# Patient Record
Sex: Female | Born: 1940 | Race: White | Hispanic: No | Marital: Married | Smoking: Current every day smoker
Health system: Southern US, Community
[De-identification: ages and names within clinical notes are randomized; demographics above are authoritative.]

## PROBLEM LIST (undated history)

## (undated) DIAGNOSIS — I890 Lymphedema, not elsewhere classified: Secondary | ICD-10-CM

## (undated) DIAGNOSIS — G8929 Other chronic pain: Secondary | ICD-10-CM

## (undated) DIAGNOSIS — M549 Dorsalgia, unspecified: Secondary | ICD-10-CM

## (undated) DIAGNOSIS — E785 Hyperlipidemia, unspecified: Secondary | ICD-10-CM

## (undated) DIAGNOSIS — F17200 Nicotine dependence, unspecified, uncomplicated: Secondary | ICD-10-CM

## (undated) DIAGNOSIS — I10 Essential (primary) hypertension: Secondary | ICD-10-CM

## (undated) HISTORY — DX: Nicotine dependence, unspecified, uncomplicated: F17.200

## (undated) HISTORY — PX: BACK SURGERY: SHX140

## (undated) HISTORY — DX: Lymphedema, not elsewhere classified: I89.0

---

## 2011-02-12 DIAGNOSIS — M159 Polyosteoarthritis, unspecified: Secondary | ICD-10-CM | POA: Insufficient documentation

## 2011-07-17 DIAGNOSIS — R609 Edema, unspecified: Secondary | ICD-10-CM | POA: Insufficient documentation

## 2011-08-20 DIAGNOSIS — S96909A Unspecified injury of unspecified muscle and tendon at ankle and foot level, unspecified foot, initial encounter: Secondary | ICD-10-CM | POA: Insufficient documentation

## 2011-08-20 DIAGNOSIS — L02419 Cutaneous abscess of limb, unspecified: Secondary | ICD-10-CM | POA: Insufficient documentation

## 2012-02-20 DIAGNOSIS — M62838 Other muscle spasm: Secondary | ICD-10-CM | POA: Insufficient documentation

## 2012-02-20 DIAGNOSIS — M542 Cervicalgia: Secondary | ICD-10-CM | POA: Insufficient documentation

## 2012-05-07 DIAGNOSIS — M48061 Spinal stenosis, lumbar region without neurogenic claudication: Secondary | ICD-10-CM | POA: Insufficient documentation

## 2012-06-30 DIAGNOSIS — Z8249 Family history of ischemic heart disease and other diseases of the circulatory system: Secondary | ICD-10-CM | POA: Insufficient documentation

## 2012-06-30 DIAGNOSIS — G8929 Other chronic pain: Secondary | ICD-10-CM | POA: Insufficient documentation

## 2013-02-15 DIAGNOSIS — Z9889 Other specified postprocedural states: Secondary | ICD-10-CM | POA: Insufficient documentation

## 2013-02-15 DIAGNOSIS — Z9289 Personal history of other medical treatment: Secondary | ICD-10-CM | POA: Insufficient documentation

## 2013-11-10 DIAGNOSIS — M19019 Primary osteoarthritis, unspecified shoulder: Secondary | ICD-10-CM | POA: Insufficient documentation

## 2014-09-02 DIAGNOSIS — S82831A Other fracture of upper and lower end of right fibula, initial encounter for closed fracture: Secondary | ICD-10-CM | POA: Insufficient documentation

## 2014-09-05 DIAGNOSIS — Z8701 Personal history of pneumonia (recurrent): Secondary | ICD-10-CM | POA: Insufficient documentation

## 2014-10-11 DIAGNOSIS — R0602 Shortness of breath: Secondary | ICD-10-CM | POA: Insufficient documentation

## 2014-12-25 DIAGNOSIS — W19XXXA Unspecified fall, initial encounter: Secondary | ICD-10-CM | POA: Insufficient documentation

## 2014-12-25 DIAGNOSIS — M2550 Pain in unspecified joint: Secondary | ICD-10-CM | POA: Insufficient documentation

## 2014-12-25 DIAGNOSIS — R0782 Intercostal pain: Secondary | ICD-10-CM | POA: Insufficient documentation

## 2014-12-26 DIAGNOSIS — R0789 Other chest pain: Secondary | ICD-10-CM | POA: Insufficient documentation

## 2014-12-26 DIAGNOSIS — M25551 Pain in right hip: Secondary | ICD-10-CM | POA: Insufficient documentation

## 2015-12-11 DIAGNOSIS — R011 Cardiac murmur, unspecified: Secondary | ICD-10-CM | POA: Insufficient documentation

## 2016-04-23 DIAGNOSIS — L909 Atrophic disorder of skin, unspecified: Secondary | ICD-10-CM | POA: Insufficient documentation

## 2016-04-23 DIAGNOSIS — Z96612 Presence of left artificial shoulder joint: Secondary | ICD-10-CM | POA: Insufficient documentation

## 2016-04-23 DIAGNOSIS — R29898 Other symptoms and signs involving the musculoskeletal system: Secondary | ICD-10-CM | POA: Insufficient documentation

## 2016-06-04 DIAGNOSIS — Z7952 Long term (current) use of systemic steroids: Secondary | ICD-10-CM | POA: Insufficient documentation

## 2017-02-14 DIAGNOSIS — Z79891 Long term (current) use of opiate analgesic: Secondary | ICD-10-CM | POA: Insufficient documentation

## 2017-02-14 DIAGNOSIS — Z5181 Encounter for therapeutic drug level monitoring: Secondary | ICD-10-CM | POA: Insufficient documentation

## 2017-02-14 DIAGNOSIS — M19171 Post-traumatic osteoarthritis, right ankle and foot: Secondary | ICD-10-CM | POA: Insufficient documentation

## 2017-02-18 DIAGNOSIS — I359 Nonrheumatic aortic valve disorder, unspecified: Secondary | ICD-10-CM | POA: Insufficient documentation

## 2017-03-25 DIAGNOSIS — M7918 Myalgia, other site: Secondary | ICD-10-CM | POA: Insufficient documentation

## 2017-03-25 DIAGNOSIS — M25569 Pain in unspecified knee: Secondary | ICD-10-CM | POA: Insufficient documentation

## 2017-03-25 DIAGNOSIS — M5481 Occipital neuralgia: Secondary | ICD-10-CM | POA: Insufficient documentation

## 2017-04-11 DIAGNOSIS — H6123 Impacted cerumen, bilateral: Secondary | ICD-10-CM | POA: Insufficient documentation

## 2017-07-16 DIAGNOSIS — M5136 Other intervertebral disc degeneration, lumbar region: Secondary | ICD-10-CM | POA: Insufficient documentation

## 2017-07-16 DIAGNOSIS — M51369 Other intervertebral disc degeneration, lumbar region without mention of lumbar back pain or lower extremity pain: Secondary | ICD-10-CM | POA: Insufficient documentation

## 2017-07-16 DIAGNOSIS — M1990 Unspecified osteoarthritis, unspecified site: Secondary | ICD-10-CM | POA: Insufficient documentation

## 2017-07-16 DIAGNOSIS — G243 Spasmodic torticollis: Secondary | ICD-10-CM | POA: Insufficient documentation

## 2017-07-16 DIAGNOSIS — M961 Postlaminectomy syndrome, not elsewhere classified: Secondary | ICD-10-CM | POA: Insufficient documentation

## 2017-07-16 DIAGNOSIS — M792 Neuralgia and neuritis, unspecified: Secondary | ICD-10-CM | POA: Insufficient documentation

## 2017-08-29 DIAGNOSIS — K5903 Drug induced constipation: Secondary | ICD-10-CM | POA: Insufficient documentation

## 2017-08-29 DIAGNOSIS — T402X5A Adverse effect of other opioids, initial encounter: Secondary | ICD-10-CM | POA: Insufficient documentation

## 2017-10-18 ENCOUNTER — Encounter: Payer: Self-pay | Admitting: Emergency Medicine

## 2017-10-18 ENCOUNTER — Inpatient Hospital Stay
Admission: EM | Admit: 2017-10-18 | Discharge: 2017-10-20 | DRG: 193 | Disposition: A | Discharge status: Home / Self Care | Payer: Medicare Other | Attending: Internal Medicine | Admitting: Internal Medicine

## 2017-10-18 ENCOUNTER — Emergency Department: Payer: Medicare Other

## 2017-10-18 DIAGNOSIS — E785 Hyperlipidemia, unspecified: Secondary | ICD-10-CM | POA: Diagnosis present

## 2017-10-18 DIAGNOSIS — G8929 Other chronic pain: Secondary | ICD-10-CM | POA: Diagnosis present

## 2017-10-18 DIAGNOSIS — J9601 Acute respiratory failure with hypoxia: Secondary | ICD-10-CM | POA: Diagnosis present

## 2017-10-18 DIAGNOSIS — I10 Essential (primary) hypertension: Secondary | ICD-10-CM | POA: Diagnosis present

## 2017-10-18 DIAGNOSIS — B349 Viral infection, unspecified: Secondary | ICD-10-CM | POA: Diagnosis present

## 2017-10-18 DIAGNOSIS — Z882 Allergy status to sulfonamides status: Secondary | ICD-10-CM | POA: Diagnosis not present

## 2017-10-18 DIAGNOSIS — J9811 Atelectasis: Secondary | ICD-10-CM | POA: Diagnosis present

## 2017-10-18 DIAGNOSIS — Z886 Allergy status to analgesic agent status: Secondary | ICD-10-CM | POA: Diagnosis not present

## 2017-10-18 DIAGNOSIS — Z79899 Other long term (current) drug therapy: Secondary | ICD-10-CM

## 2017-10-18 DIAGNOSIS — Z87891 Personal history of nicotine dependence: Secondary | ICD-10-CM

## 2017-10-18 DIAGNOSIS — Y95 Nosocomial condition: Secondary | ICD-10-CM | POA: Diagnosis present

## 2017-10-18 DIAGNOSIS — M545 Low back pain: Secondary | ICD-10-CM | POA: Diagnosis present

## 2017-10-18 DIAGNOSIS — J189 Pneumonia, unspecified organism: Secondary | ICD-10-CM | POA: Diagnosis present

## 2017-10-18 HISTORY — DX: Hyperlipidemia, unspecified: E78.5

## 2017-10-18 HISTORY — DX: Other chronic pain: G89.29

## 2017-10-18 HISTORY — DX: Dorsalgia, unspecified: M54.9

## 2017-10-18 HISTORY — DX: Essential (primary) hypertension: I10

## 2017-10-18 LAB — CBC WITH DIFFERENTIAL/PLATELET
Basophils Absolute: 0 K/uL (ref 0–0.1)
Basophils Relative: 0 %
Eosinophils Absolute: 0.2 K/uL (ref 0–0.7)
Eosinophils Relative: 1 %
HCT: 39.5 % (ref 35.0–47.0)
Hemoglobin: 13.2 g/dL (ref 12.0–16.0)
Lymphocytes Relative: 4 %
Lymphs Abs: 0.6 K/uL — ABNORMAL LOW (ref 1.0–3.6)
MCH: 29.6 pg (ref 26.0–34.0)
MCHC: 33.4 g/dL (ref 32.0–36.0)
MCV: 88.6 fL (ref 80.0–100.0)
Monocytes Absolute: 0.5 K/uL (ref 0.2–0.9)
Monocytes Relative: 4 %
Neutro Abs: 12.1 K/uL — ABNORMAL HIGH (ref 1.4–6.5)
Neutrophils Relative %: 91 %
Platelets: 245 K/uL (ref 150–440)
RBC: 4.45 MIL/uL (ref 3.80–5.20)
RDW: 13.7 % (ref 11.5–14.5)
WBC: 13.4 K/uL — ABNORMAL HIGH (ref 3.6–11.0)

## 2017-10-18 LAB — COMPREHENSIVE METABOLIC PANEL WITH GFR
ALT: 20 U/L (ref 14–54)
AST: 29 U/L (ref 15–41)
Albumin: 4.1 g/dL (ref 3.5–5.0)
Alkaline Phosphatase: 87 U/L (ref 38–126)
Anion gap: 12 (ref 5–15)
BUN: 23 mg/dL — ABNORMAL HIGH (ref 6–20)
CO2: 24 mmol/L (ref 22–32)
Calcium: 10.1 mg/dL (ref 8.9–10.3)
Chloride: 102 mmol/L (ref 101–111)
Creatinine, Ser: 1.03 mg/dL — ABNORMAL HIGH (ref 0.44–1.00)
GFR calc Af Amer: 60 mL/min — ABNORMAL LOW
GFR calc non Af Amer: 51 mL/min — ABNORMAL LOW
Glucose, Bld: 114 mg/dL — ABNORMAL HIGH (ref 65–99)
Potassium: 3.8 mmol/L (ref 3.5–5.1)
Sodium: 138 mmol/L (ref 135–145)
Total Bilirubin: 0.5 mg/dL (ref 0.3–1.2)
Total Protein: 7 g/dL (ref 6.5–8.1)

## 2017-10-18 LAB — TROPONIN I

## 2017-10-18 MED ORDER — CEFTRIAXONE SODIUM IN DEXTROSE 20 MG/ML IV SOLN: 1.0000 g | Freq: Once | INTRAVENOUS | Status: DC

## 2017-10-18 MED ORDER — DEXTROSE 5 % IV SOLN
500.0000 mg | Freq: Once | INTRAVENOUS | Status: AC
  Administered 2017-10-19: 500 mg via INTRAVENOUS
  Filled 2017-10-18: qty 500

## 2017-10-18 MED ORDER — IOPAMIDOL (ISOVUE-370) INJECTION 76%
75.0000 mL | Freq: Once | INTRAVENOUS | Status: AC | PRN
  Administered 2017-10-18: 75 mL via INTRAVENOUS

## 2017-10-18 MED ORDER — SODIUM CHLORIDE 0.9 % IV BOLUS (SEPSIS)
1000.0000 mL | Freq: Once | INTRAVENOUS | Status: AC
  Administered 2017-10-19: 1000 mL via INTRAVENOUS

## 2017-10-18 MED ORDER — VANCOMYCIN HCL IN DEXTROSE 1-5 GM/200ML-% IV SOLN: 1000.0000 mg | Freq: Once | INTRAVENOUS | Status: DC

## 2017-10-18 MED ORDER — DEXTROSE 5 % IV SOLN
1.0000 g | Freq: Once | INTRAVENOUS | Status: AC
  Administered 2017-10-19: 1 g via INTRAVENOUS
  Filled 2017-10-18: qty 1

## 2017-10-18 NOTE — ED Provider Notes (Signed)
[facility] Emergency Department Provider Note  ____________________________________________  Time seen: Approximately 11:36 PM  I have reviewed the triage vital signs and the nursing notes.   HISTORY  Chief Complaint Shortness of Breath   HPI Madison Benson is a 76 y.o. female with h/o HTN, HLD, and former smoker presents for evaluation ofshortness of breath. Patient reports that her symptoms started earlier today. She has been having progressively worsening shortness of breath. She has had a cough productive of yellow sputum over the last 3-4 days and chills. No fever. She has had nausea but no vomiting or diarrhea. No chest pain. This evening her shortness of breath became severe and she was found to be hypoxic to 87% on room air. No personal or family history of blood clots, no recent travel or immobilization, no hemoptysis, no leg pain or swelling.  Past Medical History:  Diagnosis Date  . Hypertension     There are no active problems to display for this patient.   Past Surgical History:  Procedure Laterality Date  . BACK SURGERY      Prior to Admission medications   Not on File    Allergies Nsaids and Sulfa antibiotics  History reviewed. No pertinent family history.  Social History Social History  Substance Use Topics  . Smoking status: Never Smoker  . Smokeless tobacco: Never Used  . Alcohol use 3.0 oz/week    5 Glasses of wine per week    Review of Systems  Constitutional: Negative for fever. + chills Eyes: Negative for visual changes. ENT: Negative for sore throat. Neck: No neck pain  Cardiovascular: Negative for chest pain. Respiratory: + shortness of breath and cough Gastrointestinal: Negative for abdominal pain, vomiting or diarrhea. Genitourinary: Negative for dysuria. Musculoskeletal: Negative for back pain. Skin: Negative for rash. Neurological: Negative for headaches, weakness or numbness. Psych: No SI or  HI  ____________________________________________   PHYSICAL EXAM:  VITAL SIGNS: ED Triage Vitals  Enc Vitals Group     BP 10/18/17 2151 (!) 206/96     Pulse Rate 10/18/17 2151 95     Resp 10/18/17 2151 (!) 22     Temp 10/18/17 2151 98.3 F (36.8 C)     Temp Source 10/18/17 2151 Oral     SpO2 10/18/17 2148 (!) 89 %     Weight 10/18/17 2154 165 lb (74.8 kg)     Height 10/18/17 2154 5' (1.524 m)     Head Circumference --      Peak Flow --      Pain Score 10/18/17 2149 7     Pain Loc --      Pain Edu? --      Excl. in GC? --     Constitutional: Alert and oriented. Well appearing and in no apparent distress. HEENT:      Head: Normocephalic and atraumatic.         Eyes: Conjunctivae are normal. Sclera is non-icteric.       Mouth/Throat: Mucous membranes are moist.       Neck: Supple with no signs of meningismus. Cardiovascular: Regular rate and rhythm. No murmurs, gallops, or rubs. 2+ symmetrical distal pulses are present in all extremities. No JVD. Respiratory: increased work of breathing, tachypnea, coarse rhonchi on the bases worse on the left, hypoxic on room air. Gastrointestinal: Soft, non tender, and non distended with positive bowel sounds. No rebound or guarding. Musculoskeletal: Nontender with normal range of motion in all extremities. No edema, cyanosis, or  erythema of extremities. Neurologic: Normal speech and language. Face is symmetric. Moving all extremities. No gross focal neurologic deficits are appreciated. Skin: Skin is warm, dry and intact. No rash noted. Psychiatric: Mood and affect are normal. Speech and behavior are normal.  ____________________________________________   LABS (all labs ordered are listed, but only abnormal results are displayed)  Labs Reviewed  CBC WITH DIFFERENTIAL/PLATELET - Abnormal; Notable for the following:       Result Value   WBC 13.4 (*)    Neutro Abs 12.1 (*)    Lymphs Abs 0.6 (*)    All other components within normal  limits  COMPREHENSIVE METABOLIC PANEL - Abnormal; Notable for the following:    Glucose, Bld 114 (*)    BUN 23 (*)    Creatinine, Ser 1.03 (*)    GFR calc non Af Amer 51 (*)    GFR calc Af Amer 60 (*)    All other components within normal limits  CULTURE, BLOOD (ROUTINE X 2)  CULTURE, BLOOD (ROUTINE X 2)  TROPONIN I  INFLUENZA PANEL BY PCR (TYPE A & B)  URINALYSIS, COMPLETE (UACMP) WITH MICROSCOPIC   ____________________________________________  EKG  ED ECG REPORT I, Nita Sickle, the attending physician, personally viewed and interpreted this ECG.  Normal sinus rhythm, rate of 95, normal intervals, left axis deviation, no ST elevations or depressions. No prior for comparison. ____________________________________________  RADIOLOGY  CXR:  1. Aortic atherosclerosis without acute pulmonary disease. 2. Calcified granuloma in the left mid lung. 3. Mild diffuse interstitial prominence, nonspecific but may reflect chronic interstitial disease or fibrosis.  CTA chest: 1. No evidence of significant pulmonary embolus. 2. Calcified granulomas with calcified hilar and mediastinal lymph nodes consistent with postinflammatory changes. 3. No evidence of active pulmonary disease. 4. Aortic atherosclerosis. Coronary artery calcification. 5. Parapelvic cyst versus hydronephrosis of the left kidney. ____________________________________________   PROCEDURES  Procedure(s) performed: None Procedures Critical Care performed: yes  CRITICAL CARE Performed by: Nita Sickle  ?  Total critical care time: 35 min  Critical care time was exclusive of separately billable procedures and treating other patients.  Critical care was necessary to treat or prevent imminent or life-threatening deterioration.  Critical care was time spent personally by me on the following activities: development of treatment plan with patient and/or surrogate as well as nursing, discussions with  consultants, evaluation of patient's response to treatment, examination of patient, obtaining history from patient or surrogate, ordering and performing treatments and interventions, ordering and review of laboratory studies, ordering and review of radiographic studies, pulse oximetry and re-evaluation of patient's condition.  ____________________________________________   INITIAL IMPRESSION / ASSESSMENT AND PLAN / ED COURSE  76 y.o. female with h/o HTN, HLD, and former smoker presents for evaluation ofprogressively worsening shortness of breath since this morning in the setting of 3-4 days of productive cough and chills. patient hypoxic on room air requiring 2 L nasal cannula, increased work of breathing, with coarse rhonchi on the bases worse on the left. Clinically patient meets criteria for pneumonia with chills, productive cough, new oxygen requirement, elevated WBC, and bilateral rhonchi. No infiltrate or PE on CTA. Will cover for HCAP. Will give IVF and admit to Hospitalist.       As part of my medical decision making, I reviewed the following data within the electronic MEDICAL RECORD NUMBER Nursing notes reviewed and incorporated, Labs reviewed , EKG interpreted , Radiograph reviewed , Discussed with admitting physician , Notes from prior ED visits and [place] Controlled  Substance Database    Pertinent labs & imaging results that were available during my care of the patient were reviewed by me and considered in my medical decision making (see chart for details).    ____________________________________________   FINAL CLINICAL IMPRESSION(S) / ED DIAGNOSES  Final diagnoses:  Acute respiratory failure with hypoxia (HCC)  HCAP (healthcare-associated pneumonia)      NEW MEDICATIONS STARTED DURING THIS VISIT:  New Prescriptions   No medications on file     Note:  This document was prepared using Dragon voice recognition software and may include unintentional dictation errors.      Nita Sickle, MD 10/18/17 [phone]

## 2017-10-18 NOTE — H&P (Signed)
[facility] - [place] [facility]   PATIENT NAME: Madison Benson    MR#:  [id number]  DATE OF BIRTH:  07/17/1941  DATE OF ADMISSION:  10/18/2017  PRIMARY CARE PHYSICIAN: Lyndon CodeKhan, Fozia M, MD   REQUESTING/REFERRING PHYSICIAN: Don PerkingVeronese, MD  CHIEF COMPLAINT:   Chief Complaint  Patient presents with  . Shortness of Breath    HISTORY OF PRESENT ILLNESS:  Madison Benson  is a 76 y.o. female who presents with 2 days of productive cough, with acute onset of shortness of breath today. Patient came to the ED for evaluation and was found here to have likely pneumonia. She lives in an assisted living facility. Hospitalists were called for admission and treatment for HCAP  PAST MEDICAL HISTORY:   Past Medical History:  Diagnosis Date  . Chronic back pain   . HLD (hyperlipidemia)   . Hypertension     PAST SURGICAL HISTORY:   Past Surgical History:  Procedure Laterality Date  . BACK SURGERY      SOCIAL HISTORY:   Social History  Substance Use Topics  . Smoking status: Never Smoker  . Smokeless tobacco: Never Used  . Alcohol use 3.0 oz/week    5 Glasses of wine per week    FAMILY HISTORY:   Family History  Problem Relation Age of Onset  . Hypertension Other     DRUG ALLERGIES:   Allergies  Allergen Reactions  . Nsaids Nausea And Vomiting  . Sulfa Antibiotics     MEDICATIONS AT HOME:   Prior to Admission medications   Not on File    REVIEW OF SYSTEMS:  Review of Systems  Constitutional: Negative for chills, fever, malaise/fatigue and weight loss.  HENT: Negative for ear pain, hearing loss and tinnitus.   Eyes: Negative for blurred vision, double vision, pain and redness.  Respiratory: Positive for cough, sputum production and shortness of breath. Negative for hemoptysis.   Cardiovascular: Negative for chest pain, palpitations, orthopnea and leg swelling.  Gastrointestinal: Negative for abdominal pain, constipation,  diarrhea, nausea and vomiting.  Genitourinary: Negative for dysuria, frequency and hematuria.  Musculoskeletal: Negative for back pain, joint pain and neck pain.  Skin:       No acne, rash, or lesions  Neurological: Negative for dizziness, tremors, focal weakness and weakness.  Endo/Heme/Allergies: Negative for polydipsia. Does not bruise/bleed easily.  Psychiatric/Behavioral: Negative for depression. The patient is not nervous/anxious and does not have insomnia.      VITAL SIGNS:   Vitals:   10/18/17 2148 10/18/17 2151 10/18/17 2154 10/18/17 2219  BP:  (!) 206/96  (!) 186/75  Pulse:  95  97  Resp:  (!) 22  (!) 22  Temp:  98.3 F (36.8 C)    TempSrc:  Oral    SpO2: (!) 89% (!) 87%  94%  Weight:   74.8 kg (165 lb)   Height:   5' (1.524 m)    Wt Readings from Last 3 Encounters:  10/18/17 74.8 kg (165 lb)    PHYSICAL EXAMINATION:  Physical Exam  Vitals reviewed. Constitutional: She is oriented to person, place, and time. She appears well-developed and well-nourished. No distress.  HENT:  Head: Normocephalic and atraumatic.  Mouth/Throat: Oropharynx is clear and moist.  Eyes: Pupils are equal, round, and reactive to light. Conjunctivae and EOM are normal. No scleral icterus.  Neck: Normal range of motion. Neck supple. No JVD present. No thyromegaly present.  Cardiovascular: Normal rate, regular rhythm and intact distal pulses.  Exam  reveals no gallop and no friction rub.   No murmur heard. Respiratory: Effort normal. No respiratory distress. She has no wheezes. She has no rales.  Bilateral rhonchi  GI: Soft. Bowel sounds are normal. She exhibits no distension. There is no tenderness.  Musculoskeletal: Normal range of motion. She exhibits no edema.  No arthritis, no gout  Lymphadenopathy:    She has no cervical adenopathy.  Neurological: She is alert and oriented to person, place, and time. No cranial nerve deficit.  No dysarthria, no aphasia  Skin: Skin is warm and dry. No  rash noted. No erythema.  Psychiatric: She has a normal mood and affect. Her behavior is normal. Judgment and thought content normal.    LABORATORY PANEL:   CBC  Recent Labs Lab 10/18/17 2156  WBC 13.4*  HGB 13.2  HCT 39.5  PLT 245   ------------------------------------------------------------------------------------------------------------------  Chemistries   Recent Labs Lab 10/18/17 2156  NA 138  K 3.8  CL 102  CO2 24  GLUCOSE 114*  BUN 23*  CREATININE 1.03*  CALCIUM 10.1  AST 29  ALT 20  ALKPHOS 87  BILITOT 0.5   ------------------------------------------------------------------------------------------------------------------  Cardiac Enzymes  Recent Labs Lab 10/18/17 2156  TROPONINI <0.03   ------------------------------------------------------------------------------------------------------------------  RADIOLOGY:  Ct Angio Chest Pe W And/or Wo Contrast  Result Date: 10/18/2017 CLINICAL DATA:  Shortness of breath and hypertension tonight. EXAM: CT ANGIOGRAPHY CHEST WITH CONTRAST TECHNIQUE: Multidetector CT imaging of the chest was performed using the standard protocol during bolus administration of intravenous contrast. Multiplanar CT image reconstructions and MIPs were obtained to evaluate the vascular anatomy. CONTRAST:  75 mL Isovue 370 COMPARISON:  None. FINDINGS: Cardiovascular: Good opacification of central and segmental pulmonary arteries. Examination is somewhat limited due to motion artifact. No filling defects identified. No evidence of significant pulmonary embolus. Normal heart size. No pericardial effusion. Normal caliber thoracic aorta. No dissection. Great vessel origins are patent. Calcifications in the aorta and coronary arteries. Mediastinum/Nodes: Calcified hilar and mediastinal lymph nodes consistent with postinflammatory change. No significant lymphadenopathy. Esophagus is decompressed. Lungs/Pleura: Calcified granulomas in the lungs.  Azygos lobe. Evaluation of the parenchyma is limited by significant respiratory motion. However, there is no evidence of focal consolidation or airspace disease. There is mild atelectasis in the lung bases. No pneumothorax. No pleural effusions. Central airways are patent. Upper Abdomen: Calcified granulomas in the liver and spleen. Parapelvic cysts versus hydronephrosis of the left kidney. Musculoskeletal: Postoperative changes in the cervical spine and lumbar spine. Diffuse degenerative changes throughout the spine. Sclerosis about the endplates is likely degenerative. No destructive bone lesions. Review of the MIP images confirms the above findings. IMPRESSION: 1. No evidence of significant pulmonary embolus. 2. Calcified granulomas with calcified hilar and mediastinal lymph nodes consistent with postinflammatory changes. 3. No evidence of active pulmonary disease. 4. Aortic atherosclerosis.  Coronary artery calcification. 5. Parapelvic cyst versus hydronephrosis of the left kidney. Electronically Signed   By: Burman Nieves M.D.   On: 10/18/2017 23:30   Dg Chest Portable 1 View  Result Date: 10/18/2017 CLINICAL DATA:  Dyspnea EXAM: PORTABLE CHEST 1 VIEW COMPARISON:  None FINDINGS: Heart is top-normal in size. There is aortic atherosclerosis with uncoiling of the thoracic aorta. Mild diffuse interstitial prominence is noted which may reflect a nonspecific interstitial lung disease or age related interstitial fibrotic change. Calcified granuloma is seen in the left mid lung with minimal left basilar atelectasis. No pneumonic consolidation or CHF. No effusion or pneumothorax. Cervical and thoracolumbar fixation hardware is  noted. 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 Subchondral and subcortical cystic changes are noted of the humeral head. IMPRESSION: 1. Aortic atherosclerosis without acute pulmonary disease. 2.  Calcified granuloma in the left mid lung. 3. Mild diffuse interstitial prominence, nonspecific but may reflect chronic interstitial disease or fibrosis. Electronically Signed   By: Tollie Eth M.D.   On: 10/18/2017 22:22    EKG:   Orders placed or performed during the hospital encounter of 10/18/17  . ED EKG within 10 minutes  . ED EKG within 10 minutes  . EKG 12-Lead  . EKG 12-Lead    IMPRESSION AND PLAN:  Principal Problem:   HCAP (healthcare-associated pneumonia) - broad-spectrum IV antibiotics, when necessary DuoNeb's and antitussive Active Problems:   HTN (hypertension) - continue home meds   HLD (hyperlipidemia) - continue home meds  All the records are reviewed and case discussed with ED provider. Management plans discussed with the patient and/or family.  DVT PROPHYLAXIS: SubQ lovenox  GI PROPHYLAXIS: None  ADMISSION STATUS: Inpatient  CODE STATUS: Full Code Status History    This patient does not have a recorded code status. Please follow your organizational policy for patients in this situation.      TOTAL TIME TAKING CARE OF THIS PATIENT: 45 minutes.   Anne Hahn, Annesha Delgreco FIELDING 10/18/2017, 11:49 PM  [facility]  Office  [phone]  CC: Primary care physician; Lyndon Code, MD  Note:  This document was prepared using Dragon voice recognition software and may include unintentional dictation errors.

## 2017-10-18 NOTE — ED Triage Notes (Signed)
[facility] to Emergency Department via [facility] SHOB.  Pt denies hx of pulmonary diseases, multiple back surgeries and cervical fusion, hx of HTN and didn't take meds tonight.   EMS gave 3 lpm [place] and pt got to 94%.  Reported took pain meds approx 2100 Fentanyl and oxycodone.  Pt uses walker and wheelchair for mobility.

## 2017-10-19 LAB — URINALYSIS, COMPLETE (UACMP) WITH MICROSCOPIC
Bilirubin Urine: NEGATIVE
Glucose, UA: NEGATIVE mg/dL
Hgb urine dipstick: NEGATIVE
KETONES UR: 20 mg/dL — AB
Leukocytes, UA: NEGATIVE
Nitrite: NEGATIVE
PROTEIN: NEGATIVE mg/dL
Specific Gravity, Urine: 1.023 (ref 1.005–1.030)
pH: 5 (ref 5.0–8.0)

## 2017-10-19 LAB — BASIC METABOLIC PANEL
Anion gap: 11 (ref 5–15)
BUN: 23 mg/dL — AB (ref 6–20)
CHLORIDE: 102 mmol/L (ref 101–111)
CO2: 22 mmol/L (ref 22–32)
Calcium: 9.5 mg/dL (ref 8.9–10.3)
Creatinine, Ser: 0.83 mg/dL (ref 0.44–1.00)
GFR calc Af Amer: >60 mL/min (ref >60)
GFR calc non Af Amer: >60 mL/min (ref >60)
Glucose, Bld: 157 mg/dL — ABNORMAL HIGH (ref 65–99)
POTASSIUM: 3.8 mmol/L (ref 3.5–5.1)
SODIUM: 135 mmol/L (ref 135–145)

## 2017-10-19 LAB — CBC
HEMATOCRIT: 35.8 % (ref 35.0–47.0)
Hemoglobin: 11.9 g/dL — ABNORMAL LOW (ref 12.0–16.0)
MCH: 29.5 pg (ref 26.0–34.0)
MCHC: 33.3 g/dL (ref 32.0–36.0)
MCV: 88.5 fL (ref 80.0–100.0)
Platelets: 219 10*3/uL (ref 150–440)
RBC: 4.04 MIL/uL (ref 3.80–5.20)
RDW: 14 % (ref 11.5–14.5)
WBC: 14.1 10*3/uL — AB (ref 3.6–11.0)

## 2017-10-19 LAB — INFLUENZA PANEL BY PCR (TYPE A & B)
Influenza A By PCR: NEGATIVE
Influenza B By PCR: NEGATIVE

## 2017-10-19 LAB — MRSA PCR SCREENING: MRSA by PCR: NEGATIVE

## 2017-10-19 MED ORDER — ONDANSETRON HCL 4 MG PO TABS: 4.0000 mg | ORAL_TABLET | Freq: Four times a day (QID) | ORAL | Status: DC | PRN

## 2017-10-19 MED ORDER — MAGNESIUM OXIDE 400 (241.3 MG) MG PO TABS
400.0000 mg | ORAL_TABLET | Freq: Every day | ORAL | Status: DC
  Administered 2017-10-19 – 2017-10-20 (×2): 400 mg via ORAL
  Filled 2017-10-19 (×2): qty 1

## 2017-10-19 MED ORDER — LOSARTAN POTASSIUM 50 MG PO TABS
50.0000 mg | ORAL_TABLET | Freq: Every day | ORAL | Status: DC
  Administered 2017-10-19: 50 mg via ORAL
  Filled 2017-10-19 (×3): qty 1

## 2017-10-19 MED ORDER — FENTANYL 50 MCG/HR TD PT72: 50.0000 ug | MEDICATED_PATCH | TRANSDERMAL | Status: DC

## 2017-10-19 MED ORDER — ACETAMINOPHEN 650 MG RE SUPP: 650.0000 mg | Freq: Four times a day (QID) | RECTAL | Status: DC | PRN

## 2017-10-19 MED ORDER — ONDANSETRON HCL 4 MG/2ML IJ SOLN: 4.0000 mg | Freq: Four times a day (QID) | INTRAMUSCULAR | Status: DC | PRN

## 2017-10-19 MED ORDER — DULOXETINE HCL 60 MG PO CPEP
60.0000 mg | ORAL_CAPSULE | Freq: Every day | ORAL | Status: DC
  Administered 2017-10-19: 60 mg via ORAL
  Filled 2017-10-19 (×2): qty 1

## 2017-10-19 MED ORDER — GUAIFENESIN-DM 100-10 MG/5ML PO SYRP: 5.0000 mL | ORAL_SOLUTION | ORAL | Status: DC | PRN

## 2017-10-19 MED ORDER — FENTANYL 50 MCG/HR TD PT72
50.0000 ug | MEDICATED_PATCH | TRANSDERMAL | Status: DC
  Administered 2017-10-19: 50 ug via TRANSDERMAL
  Filled 2017-10-19: qty 1

## 2017-10-19 MED ORDER — VANCOMYCIN HCL IN DEXTROSE 1-5 GM/200ML-% IV SOLN
1000.0000 mg | Freq: Once | INTRAVENOUS | Status: AC
  Administered 2017-10-19: 1000 mg via INTRAVENOUS
  Filled 2017-10-19: qty 200

## 2017-10-19 MED ORDER — DEXTROSE 5 % IV SOLN
1.0000 g | Freq: Once | INTRAVENOUS | Status: AC
  Administered 2017-10-19: 1 g via INTRAVENOUS
  Filled 2017-10-19: qty 1

## 2017-10-19 MED ORDER — SIMVASTATIN 20 MG PO TABS
40.0000 mg | ORAL_TABLET | Freq: Every day | ORAL | Status: DC
  Administered 2017-10-19: 40 mg via ORAL
  Filled 2017-10-19 (×2): qty 2

## 2017-10-19 MED ORDER — OXYCODONE HCL 5 MG PO TABS
10.0000 mg | ORAL_TABLET | Freq: Three times a day (TID) | ORAL | Status: DC
  Administered 2017-10-19 – 2017-10-20 (×4): 10 mg via ORAL
  Filled 2017-10-19 (×4): qty 2

## 2017-10-19 MED ORDER — IPRATROPIUM-ALBUTEROL 0.5-2.5 (3) MG/3ML IN SOLN
3.0000 mL | RESPIRATORY_TRACT | Status: DC | PRN
  Administered 2017-10-19: 3 mL via RESPIRATORY_TRACT
  Filled 2017-10-19: qty 3

## 2017-10-19 MED ORDER — RISAQUAD PO CAPS
1.0000 | ORAL_CAPSULE | Freq: Every day | ORAL | Status: DC
  Administered 2017-10-19 – 2017-10-20 (×2): 1 via ORAL
  Filled 2017-10-19 (×2): qty 1

## 2017-10-19 MED ORDER — FERROUS SULFATE 325 (65 FE) MG PO TABS
325.0000 mg | ORAL_TABLET | Freq: Every day | ORAL | Status: DC
  Administered 2017-10-19 – 2017-10-20 (×2): 325 mg via ORAL
  Filled 2017-10-19 (×2): qty 1

## 2017-10-19 MED ORDER — TIZANIDINE HCL 4 MG PO TABS
4.0000 mg | ORAL_TABLET | Freq: Two times a day (BID) | ORAL | Status: DC
  Administered 2017-10-19 – 2017-10-20 (×3): 4 mg via ORAL
  Filled 2017-10-19 (×4): qty 1

## 2017-10-19 MED ORDER — ENOXAPARIN SODIUM 40 MG/0.4ML ~~LOC~~ SOLN
40.0000 mg | SUBCUTANEOUS | Status: DC
  Administered 2017-10-19: 40 mg via SUBCUTANEOUS
  Filled 2017-10-19: qty 0.4

## 2017-10-19 MED ORDER — VITAMIN D 1000 UNITS PO TABS
1000.0000 [IU] | ORAL_TABLET | Freq: Two times a day (BID) | ORAL | Status: DC
  Administered 2017-10-19 – 2017-10-20 (×3): 1000 [IU] via ORAL
  Filled 2017-10-19 (×3): qty 1

## 2017-10-19 MED ORDER — CALCIUM CARBONATE-VITAMIN D 500-200 MG-UNIT PO TABS
1.0000 | ORAL_TABLET | Freq: Every day | ORAL | Status: DC
  Administered 2017-10-19 – 2017-10-20 (×2): 1 via ORAL
  Filled 2017-10-19 (×2): qty 1

## 2017-10-19 MED ORDER — DEXTROSE 5 % IV SOLN
2.0000 g | Freq: Two times a day (BID) | INTRAVENOUS | Status: DC
  Administered 2017-10-19 – 2017-10-20 (×3): 2 g via INTRAVENOUS
  Filled 2017-10-19 (×4): qty 2

## 2017-10-19 MED ORDER — PANTOPRAZOLE SODIUM 40 MG PO TBEC
40.0000 mg | DELAYED_RELEASE_TABLET | Freq: Every day | ORAL | Status: DC
  Administered 2017-10-19 – 2017-10-20 (×2): 40 mg via ORAL
  Filled 2017-10-19 (×2): qty 1

## 2017-10-19 MED ORDER — SODIUM CHLORIDE 0.9 % IV SOLN
INTRAVENOUS | Status: DC
  Administered 2017-10-19 – 2017-10-20 (×2): via INTRAVENOUS

## 2017-10-19 MED ORDER — ASPIRIN EC 81 MG PO TBEC
81.0000 mg | DELAYED_RELEASE_TABLET | Freq: Every day | ORAL | Status: DC
  Administered 2017-10-19 – 2017-10-20 (×2): 81 mg via ORAL
  Filled 2017-10-19 (×2): qty 1

## 2017-10-19 MED ORDER — ACETAMINOPHEN 325 MG PO TABS
650.0000 mg | ORAL_TABLET | Freq: Four times a day (QID) | ORAL | Status: DC | PRN
  Administered 2017-10-19: 650 mg via ORAL
  Filled 2017-10-19: qty 2

## 2017-10-19 MED ORDER — VANCOMYCIN HCL IN DEXTROSE 750-5 MG/150ML-% IV SOLN
750.0000 mg | INTRAVENOUS | Status: DC
  Administered 2017-10-19 – 2017-10-20 (×2): 750 mg via INTRAVENOUS
  Filled 2017-10-19 (×3): qty 150

## 2017-10-19 NOTE — Plan of Care (Signed)
Problem: Safety: Goal: Ability to remain free from injury will improve Outcome: Progressing Pt able to ring bell and make needs known.  Problem: Activity: Goal: Risk for activity intolerance will decrease Outcome: Progressing Pt uses walker to ambulate at home, also has wheelchair.

## 2017-10-19 NOTE — Progress Notes (Addendum)
ANTIBIOTIC CONSULT NOTE - INITIAL  Pharmacy Consult for Cefepime , Vancomycin  Indication: pneumonia  Allergies  Allergen Reactions  . Nsaids Diarrhea and Nausea And Vomiting  . Sulfa Antibiotics Diarrhea and Nausea And Vomiting    Patient Measurements: Height: 5' (152.4 cm) Weight: 165 lb (74.8 kg) IBW/kg (Calculated) : 45.5 Adjusted Body Weight: 57.2 kg   Vital Signs: Temp: 98.3 F (36.8 C) (10/20 2151) Temp Source: Oral (10/20 2151) BP: 171/88 (10/21 0029) Pulse Rate: 102 (10/21 0029) Intake/Output from previous day: No intake/output data recorded. Intake/Output from this shift: No intake/output data recorded.  Labs:  Recent Labs  10/18/17 2156  WBC 13.4*  HGB 13.2  PLT 245  CREATININE 1.03*   Estimated Creatinine Clearance: 42 mL/min (A) (by C-G formula based on SCr of 1.03 mg/dL (H)). No results for input(s): VANCOTROUGH, VANCOPEAK, VANCORANDOM, GENTTROUGH, GENTPEAK, GENTRANDOM, TOBRATROUGH, TOBRAPEAK, TOBRARND, AMIKACINPEAK, AMIKACINTROU, AMIKACIN in the last 72 hours.   Microbiology: No results found for this or any previous visit (from the past 720 hour(s)).  Medical History: Past Medical History:  Diagnosis Date  . Chronic back pain   . HLD (hyperlipidemia)   . Hypertension     Medications:  Prescriptions Prior to Admission  Medication Sig Dispense Refill Last Dose  . aspirin 81 MG EC tablet Take 81 mg by mouth daily.   10/18/2017 at Unknown time  . Calcium-Vitamin D-Vitamin K 750-500-40 MG-UNT-MCG TABS Take 1 tablet by mouth daily.   10/18/2017 at Unknown time  . cholecalciferol (VITAMIN D) 1000 units tablet Take 1,000 Units by mouth 2 (two) times daily.   10/18/2017 at Unknown time  . DULoxetine (CYMBALTA) 60 MG capsule Take 60 mg by mouth at bedtime.   Past Week at Unknown time  . fentaNYL (DURAGESIC - DOSED MCG/HR) 50 MCG/HR Place 50 mcg onto the skin every 3 (three) days.   10/17/2017  . Ferrous Sulfate Dried (EQ SLOW-RELEASE IRON) 45 MG TBCR  Take 45 mg by mouth daily.   10/18/2017 at Unknown time  . lactobacillus acidophilus (BACID) TABS tablet Take 1 tablet by mouth daily.   10/18/2017 at Unknown time  . losartan (COZAAR) 100 MG tablet Take 50 mg by mouth daily.   10/18/2017 at Unknown time  . Magnesium 100 MG TABS Take 100 mg by mouth daily.   10/18/2017 at Unknown time  . nitrofurantoin, macrocrystal-monohydrate, (MACROBID) 100 MG capsule Take 100 mg by mouth 2 (two) times daily.   10/18/2017 at Unknown time  . omeprazole (PRILOSEC) 20 MG capsule Take 20 mg by mouth daily.   10/18/2017 at Unknown time  . Oxycodone HCl 10 MG TABS Take 10 mg by mouth 3 (three) times daily.   10/18/2017 at Unknown time  . simvastatin (ZOCOR) 40 MG tablet Take 40 mg by mouth daily.   Past Week at Unknown time  . tiZANidine (ZANAFLEX) 4 MG tablet Take 4 mg by mouth 2 (two) times daily.   10/18/2017 at Unknown time   Assessment: CrCl = 42 ml/min ke = 0.039 hr-1 T1/2 = 17.8 hrs Vd = 40 L   Goal of Therapy:  Vancomycin trough level 15-20 mcg/ml  Plan:  Expected duration 7 days with resolution of temperature and/or normalization of WBC   Cefepime 1 gm IV X 1 given in ED on 10/21 @ 00:20. Cefepime 1 gm IV X 1 ordered to be given @ ~ 0200 to make total starting dose of 2 gm. Cefepime 2 gm IV Q12H ordered to start on 10/21 @  12:00.   Vancomycin 1 gm IV X 1 given on 10/21 @ 0200. Vancomycin 750 mg IV Q18H ordered to start on 10/21 @ 1200, ~ 10 hrs after 1st dose (stacked dosing). This pt will reach Css by 10/25 @ 0200. Will draw 1st trough on 10/24 @ 11:30, which will be approaching Css.   Elmo Rio D 10/19/2017,2:04 AM

## 2017-10-19 NOTE — Progress Notes (Signed)
Spoke with pharmacy pt las Fentanyl patch was placed on Thursday the 18th. Pharmacy to make changes.

## 2017-10-19 NOTE — Progress Notes (Signed)
Pharmacy has home medication lists entered. Home medications need to be ordered. Dr. Sheryle Hailiamond to place orders.

## 2017-10-19 NOTE — Progress Notes (Signed)
[facility] [place] [facility]   PATIENT NAME: Madison Benson    MR#:  [id number]  DATE OF BIRTH:  1941/03/21  SUBJECTIVE:  CHIEF COMPLAINT:   Chief Complaint  Patient presents with  . Shortness of Breath   - having chills this am,  - says that she is feeling sick.  REVIEW OF SYSTEMS:  Review of Systems  Constitutional: Positive for chills and malaise/fatigue. Negative for fever.  HENT: Negative for congestion, ear discharge, hearing loss, nosebleeds and sinus pain.   Eyes: Negative for blurred vision and double vision.  Respiratory: Positive for shortness of breath. Negative for cough and wheezing.   Cardiovascular: Negative for chest pain, palpitations and leg swelling.  Gastrointestinal: Negative for abdominal pain, constipation, diarrhea, nausea and vomiting.  Genitourinary: Negative for dysuria and urgency.  Musculoskeletal: Positive for myalgias.  Neurological: Negative for dizziness, speech change, focal weakness, seizures and headaches.  Psychiatric/Behavioral: Negative for depression.    DRUG ALLERGIES:   Allergies  Allergen Reactions  . Nsaids Diarrhea and Nausea And Vomiting  . Sulfa Antibiotics Diarrhea and Nausea And Vomiting    VITALS:  Blood pressure (!) 119/54, pulse 97, temperature 99.9 F (37.7 C), temperature source Oral, resp. rate 20, height 5' (1.524 m), weight 74.8 kg (165 lb), SpO2 95 %.  PHYSICAL EXAMINATION:  Physical Exam  GENERAL:  76 y.o.-year-old patient lying in the bed, ill appearing EYES: Pupils equal, round, reactive to light and accommodation. No scleral icterus. Extraocular muscles intact.  HEENT: Head atraumatic, normocephalic. Oropharynx and nasopharynx clear. Dry mucous membranes NECK:  Supple, no jugular venous distention. No thyroid enlargement, no tenderness.  LUNGS: Normal breath sounds bilaterally but decreased at the bases, no wheezing, rales,rhonchi or crepitation. No use of accessory muscles  of respiration.  CARDIOVASCULAR: S1, S2 normal. No murmurs, rubs, or gallops.  ABDOMEN: Soft, nontender, nondistended. Bowel sounds present. No organomegaly or mass.  EXTREMITIES: No pedal edema, cyanosis, or clubbing.  NEUROLOGIC: Cranial nerves II through XII are intact. Muscle strength 5/5 in all extremities. Sensation intact. Gait not checked.  PSYCHIATRIC: The patient is alert and oriented, but very ill appearing  SKIN: No obvious rash, lesion, or ulcer.    LABORATORY PANEL:   CBC  Recent Labs Lab 10/19/17 0311  WBC 14.1*  HGB 11.9*  HCT 35.8  PLT 219   ------------------------------------------------------------------------------------------------------------------  Chemistries   Recent Labs Lab 10/18/17 2156 10/19/17 0311  NA 138 135  K 3.8 3.8  CL 102 102  CO2 24 22  GLUCOSE 114* 157*  BUN 23* 23*  CREATININE 1.03* 0.83  CALCIUM 10.1 9.5  AST 29  --   ALT 20  --   ALKPHOS 87  --   BILITOT 0.5  --    ------------------------------------------------------------------------------------------------------------------  Cardiac Enzymes  Recent Labs Lab 10/18/17 2156  TROPONINI <0.03   ------------------------------------------------------------------------------------------------------------------  RADIOLOGY:  Ct Angio Chest Pe W And/or Wo Contrast  Result Date: 10/18/2017 CLINICAL DATA:  Shortness of breath and hypertension tonight. EXAM: CT ANGIOGRAPHY CHEST WITH CONTRAST TECHNIQUE: Multidetector CT imaging of the chest was performed using the standard protocol during bolus administration of intravenous contrast. Multiplanar CT image reconstructions and MIPs were obtained to evaluate the vascular anatomy. CONTRAST:  75 mL Isovue 370 COMPARISON:  None. FINDINGS: Cardiovascular: Good opacification of central and segmental pulmonary arteries. Examination is somewhat limited due to motion artifact. No filling defects identified. No evidence of significant  pulmonary embolus. Normal heart size. No pericardial effusion. Normal caliber thoracic  aorta. No dissection. Great vessel origins are patent. Calcifications in the aorta and coronary arteries. Mediastinum/Nodes: Calcified hilar and mediastinal lymph nodes consistent with postinflammatory change. No significant lymphadenopathy. Esophagus is decompressed. Lungs/Pleura: Calcified granulomas in the lungs. Azygos lobe. Evaluation of the parenchyma is limited by significant respiratory motion. However, there is no evidence of focal consolidation or airspace disease. There is mild atelectasis in the lung bases. No pneumothorax. No pleural effusions. Central airways are patent. Upper Abdomen: Calcified granulomas in the liver and spleen. Parapelvic cysts versus hydronephrosis of the left kidney. Musculoskeletal: Postoperative changes in the cervical spine and lumbar spine. Diffuse degenerative changes throughout the spine. Sclerosis about the endplates is likely degenerative. No destructive bone lesions. Review of the MIP images confirms the above findings. IMPRESSION: 1. No evidence of significant pulmonary embolus. 2. Calcified granulomas with calcified hilar and mediastinal lymph nodes consistent with postinflammatory changes. 3. No evidence of active pulmonary disease. 4. Aortic atherosclerosis.  Coronary artery calcification. 5. Parapelvic cyst versus hydronephrosis of the left kidney. Electronically Signed   By: Burman NievesWilliam  Stevens M.D.   On: 10/18/2017 23:30   Dg Chest Portable 1 View  Result Date: 10/18/2017 CLINICAL DATA:  Dyspnea EXAM: PORTABLE CHEST 1 VIEW COMPARISON:  None FINDINGS: Heart is top-normal in size. There is aortic atherosclerosis with uncoiling of the thoracic aorta. Mild diffuse interstitial prominence is noted which may reflect a nonspecific interstitial lung disease or age related interstitial fibrotic change. Calcified granuloma is seen in the left mid lung with minimal left basilar  atelectasis. No pneumonic consolidation or CHF. No effusion or pneumothorax. Cervical and thoracolumbar fixation hardware is noted. 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 Subchondral and subcortical cystic changes are noted of the humeral head. IMPRESSION: 1. Aortic atherosclerosis without acute pulmonary disease. 2. Calcified granuloma in the left mid lung. 3. Mild diffuse interstitial prominence, nonspecific but may reflect chronic interstitial disease or fibrosis. Electronically Signed   By: Tollie Ethavid  Kwon M.D.   On: 10/18/2017 22:22    EKG:   Orders placed or performed during the hospital encounter of 10/18/17  . ED EKG within 10 minutes  . ED EKG within 10 minutes  . EKG 12-Lead  . EKG 12-Lead    ASSESSMENT AND PLAN:   76 year old female with past medical history significant for chronic low back pain, hypertension and hyperlipidemia who lives at an assisted living facility with her husband was brought in secondary to shortness of breath.  #1 health care acquired pneumonia-appears ill -CT angiogram negative for pulmonary embolism but has bibasilar atelectasis/infiltrate. -Continue oxygen support with 2 L at this time -On vancomycin and cefepime -Influenza PCR is negative -Follow-up blood cultures due to ongoing chills. Urine analysis negative for infection  #2 hypertension-continue losartan  #3 chronic low back pain-on Cymbalta, fentanyl patch and oxycodone. Will be continued  #4 hyperlipidemia-started  #5 DVT prophylaxis-Lovenox   All the records are reviewed and case discussed with Care Management/Social Workerr. Management plans discussed with the patient, family and they are in agreement.  CODE STATUS: Full Code  TOTAL TIME TAKING CARE OF THIS PATIENT: 38 minutes.   POSSIBLE D/C IN 2-3 DAYS, DEPENDING ON CLINICAL CONDITION.   Enid BaasKALISETTI,Florence Yeung M.D on 10/19/2017 at 8:03  AM  Between 7am to 6pm - Pager - [phone]  After 6pm go to [email or web address] - password [email or web address]  [facility] [place] [facility]  Office  [phone]  CC: Primary care physician; Lyndon CodeKhan, Fozia M, MD

## 2017-10-19 NOTE — Progress Notes (Signed)
Pt alert and oriented. Shortness of breath on exertion. Remaining on 2L of acute oxygen this shift.

## 2017-10-20 LAB — BASIC METABOLIC PANEL
Anion gap: 3 — ABNORMAL LOW (ref 5–15)
BUN: 24 mg/dL — AB (ref 6–20)
CALCIUM: 9.1 mg/dL (ref 8.9–10.3)
CO2: 26 mmol/L (ref 22–32)
CREATININE: 1.11 mg/dL — AB (ref 0.44–1.00)
Chloride: 108 mmol/L (ref 101–111)
GFR calc non Af Amer: 47 mL/min — ABNORMAL LOW (ref >60)
GFR, EST AFRICAN AMERICAN: 54 mL/min — AB (ref >60)
Glucose, Bld: 108 mg/dL — ABNORMAL HIGH (ref 65–99)
Potassium: 4 mmol/L (ref 3.5–5.1)
SODIUM: 137 mmol/L (ref 135–145)

## 2017-10-20 MED ORDER — AMOXICILLIN-POT CLAVULANATE 875-125 MG PO TABS: 1.0000 | ORAL_TABLET | Freq: Two times a day (BID) | ORAL | 0 refills | Status: DC

## 2017-10-20 MED ORDER — ALBUTEROL SULFATE HFA 108 (90 BASE) MCG/ACT IN AERS: 2.0000 | INHALATION_SPRAY | Freq: Four times a day (QID) | RESPIRATORY_TRACT | 2 refills | Status: DC | PRN

## 2017-10-20 NOTE — Care Management (Signed)
[facility] Does not qualify for home O[facility]. No home health care needs identified. Case closed.

## 2017-10-20 NOTE — Progress Notes (Signed)
Patient alert and oriented, vss, chronic pain.  Went over discharge paperwork with patient and her husband.  Able to repeat back information.  Given education on incentive spirometer.  No questions.  Patient has used one before.  Patient will make her own f/u appt as her doctors office is closed for lunch.  Escorted out of hospital via wheelchair by volunteers.

## 2017-10-20 NOTE — Progress Notes (Signed)
Pt is alert and oriented. Pt stating that she is feeling much better. Still remaining on 2L of oxygen. New Fentanyl patch place to the left shoulder blade. Iv infusing without difficulty. Pt up to bedside commode and voiding without difficulty.

## 2017-10-20 NOTE — Progress Notes (Addendum)
SATURATION QUALIFICATIONS: (This note is used to comply with regulatory documentation for home oxygen)  Patient Saturations on Room Air at Rest = 97%  Patient Saturations on Room Air while Ambulating = 92%  Patient Saturations on 0 Liters of oxygen while Ambulating = 92%  Please briefly explain why patient needs home oxygen: does not qualify for 02  Patient did use a walker when ambulating (which is her baseline).  She states she has a rolling walker at home and a scooter that she uses.

## 2017-10-20 NOTE — Discharge Summary (Signed)
[facility] [place] [facility]   PATIENT NAME: Madison Benson    MR#:  [id number]  DATE OF BIRTH:  04/28/1941  DATE OF ADMISSION:  10/18/2017   ADMITTING PHYSICIAN: Oralia Manisavid Willis, MD  DATE OF DISCHARGE: 10/20/17  PRIMARY CARE PHYSICIAN: Lyndon CodeKhan, Fozia M, MD   ADMISSION DIAGNOSIS:   Acute respiratory failure with hypoxia (HCC) [J96.01] HCAP (healthcare-associated pneumonia) [J18.9]  DISCHARGE DIAGNOSIS:   Principal Problem:   HCAP (healthcare-associated pneumonia) Active Problems:   HTN (hypertension)   HLD (hyperlipidemia)   SECONDARY DIAGNOSIS:   Past Medical History:  Diagnosis Date  . Chronic back pain   . HLD (hyperlipidemia)   . Hypertension     HOSPITAL COURSE:   76 year old female with past medical history significant for chronic low back pain, hypertension and hyperlipidemia who lives at an assisted living facility with her husband was brought in secondary to shortness of breath.  #1 community acquired pneumonia-likely started as a viral infection -CT angiogram negative for pulmonary embolism but has bibasilar atelectasis/infiltrate. -required 2 L oxygen support on admission-now weaned off oxygen and at baseline -blood cultures are negative. Will be discharged on Augmentin -Influenza PCR is negative -Urine analysis negative for infection  #2 hypertension-continue losartan  #3 chronic low back pain-on Cymbalta, fentanyl patch and oxycodone. Will be continued  #4 hyperlipidemia-statin  Worked with physical therapy, patient is back at baseline. Will be discharged home today   DISCHARGE CONDITIONS:   Guarded  CONSULTS OBTAINED:   None  DRUG ALLERGIES:   Allergies  Allergen Reactions  . Nsaids Diarrhea and Nausea And Vomiting  . Sulfa Antibiotics Diarrhea and Nausea And Vomiting   DISCHARGE MEDICATIONS:   Allergies as of 10/20/2017      Reactions   Nsaids Diarrhea, Nausea And Vomiting   Sulfa Antibiotics  Diarrhea, Nausea And Vomiting      Medication List    STOP taking these medications   nitrofurantoin (macrocrystal-monohydrate) 100 MG capsule Commonly known as:  MACROBID     TAKE these medications   albuterol 108 (90 Base) MCG/ACT inhaler Commonly known as:  PROVENTIL HFA;VENTOLIN HFA Inhale 2 puffs into the lungs every 6 (six) hours as needed for wheezing or shortness of breath.   amoxicillin-clavulanate 875-125 MG tablet Commonly known as:  AUGMENTIN Take 1 tablet by mouth 2 (two) times daily. X 10 days   aspirin 81 MG EC tablet Take 81 mg by mouth daily.   Calcium-Vitamin D-Vitamin K 750-500-40 MG-UNT-MCG Tabs Take 1 tablet by mouth daily.   cholecalciferol 1000 units tablet Commonly known as:  VITAMIN D Take 1,000 Units by mouth 2 (two) times daily.   DULoxetine 60 MG capsule Commonly known as:  CYMBALTA Take 60 mg by mouth at bedtime.   EQ SLOW-RELEASE IRON 45 MG Tbcr Generic drug:  Ferrous Sulfate Dried Take 45 mg by mouth daily.   fentaNYL 50 MCG/HR Commonly known as:  DURAGESIC - dosed mcg/hr Place 50 mcg onto the skin every 3 (three) days.   lactobacillus acidophilus Tabs tablet Take 1 tablet by mouth daily.   losartan 100 MG tablet Commonly known as:  COZAAR Take 50 mg by mouth daily.   Magnesium 100 MG Tabs Take 100 mg by mouth daily.   omeprazole 20 MG capsule Commonly known as:  PRILOSEC Take 20 mg by mouth daily.   Oxycodone HCl 10 MG Tabs Take 10 mg by mouth 3 (three) times daily.   simvastatin 40 MG tablet Commonly known as:  ZOCOR  Take 40 mg by mouth daily.   tiZANidine 4 MG tablet Commonly known as:  ZANAFLEX Take 4 mg by mouth 2 (two) times daily.        DISCHARGE INSTRUCTIONS:   1. PCP f/u in 1-2 weeks  DIET:   Cardiac diet  ACTIVITY:   Activity as tolerated  OXYGEN:   Home Oxygen: No.  Oxygen Delivery: room air  DISCHARGE LOCATION:   home   If you experience worsening of your admission symptoms, develop  shortness of breath, life threatening emergency, suicidal or homicidal thoughts you must seek medical attention immediately by calling 911 or calling your MD immediately  if symptoms less severe.  You Must read complete instructions/literature along with all the possible adverse reactions/side effects for all the Medicines you take and that have been prescribed to you. Take any new Medicines after you have completely understood and accpet all the possible adverse reactions/side effects.   Please note  You were cared for by a hospitalist during your hospital stay. If you have any questions about your discharge medications or the care you received while you were in the hospital after you are discharged, you can call the unit and asked to speak with the hospitalist on call if the hospitalist that took care of you is not available. Once you are discharged, your primary care physician will handle any further medical issues. Please note that NO REFILLS for any discharge medications will be authorized once you are discharged, as it is imperative that you return to your primary care physician (or establish a relationship with a primary care physician if you do not have one) for your aftercare needs so that they can reassess your need for medications and monitor your lab values.    On the day of Discharge:  VITAL SIGNS:   Blood pressure 120/78, pulse 72, temperature 98.2 F (36.8 C), temperature source Oral, resp. rate 17, height 5' (1.524 m), weight 74.8 kg (165 lb), SpO2 94 %.  PHYSICAL EXAMINATION:    GENERAL:  76 y.o.-year-old patient lying in the bed with no acute distress.  EYES: Pupils equal, round, reactive to light and accommodation. No scleral icterus. Extraocular muscles intact.  HEENT: Head atraumatic, normocephalic. Oropharynx and nasopharynx clear.  NECK:  Supple, no jugular venous distention. No thyroid enlargement, no tenderness.  LUNGS: Normal breath sounds bilaterally, no wheezing,  rales,rhonchi or crepitation. No use of accessory muscles of respiration. Decreased bibasilar breath sounds CARDIOVASCULAR: S1, S2 normal. No rubs, or gallops. 2/6 systolic murmur is present ABDOMEN: Soft, non-tender, non-distended. Bowel sounds present. No organomegaly or mass.  EXTREMITIES: No pedal edema, cyanosis, or clubbing.  NEUROLOGIC: Cranial nerves II through XII are intact. Muscle strength 5/5 in all extremities. Sensation intact. Gait not checked. Global weakness present PSYCHIATRIC: The patient is alert and oriented x 3.  SKIN: No obvious rash, lesion, or ulcer.   DATA REVIEW:   CBC  Recent Labs Lab 10/19/17 0311  WBC 14.1*  HGB 11.9*  HCT 35.8  PLT 219    Chemistries   Recent Labs Lab 10/18/17 2156  10/20/17 0339  NA 138  < > 137  K 3.8  < > 4.0  CL 102  < > 108  CO2 24  < > 26  GLUCOSE 114*  < > 108*  BUN 23*  < > 24*  CREATININE 1.03*  < > 1.11*  CALCIUM 10.1  < > 9.1  AST 29  --   --   ALT 20  --   --  ALKPHOS 87  --   --   BILITOT 0.5  --   --   < > = values in this interval not displayed.   Microbiology Results  Results for orders placed or performed during the hospital encounter of 10/18/17  Blood culture (routine x 2)     Status: None (Preliminary result)   Collection Time: 10/19/17 12:27 AM  Result Value Ref Range Status   Specimen Description BLOOD LEFT HAND  Final   Special Requests   Final    BOTTLES DRAWN AEROBIC AND ANAEROBIC Blood Culture results may not be optimal due to an excessive volume of blood received in culture bottles   Culture NO GROWTH 1 DAY  Final   Report Status PENDING  Incomplete  Blood culture (routine x 2)     Status: None (Preliminary result)   Collection Time: 10/19/17 12:27 AM  Result Value Ref Range Status   Specimen Description BLOOD RIGHT ANTECUBITAL  Final   Special Requests   Final    BOTTLES DRAWN AEROBIC AND ANAEROBIC Blood Culture adequate volume   Culture NO GROWTH 1 DAY  Final   Report Status  PENDING  Incomplete  MRSA PCR Screening     Status: None   Collection Time: 10/19/17  1:29 AM  Result Value Ref Range Status   MRSA by PCR NEGATIVE NEGATIVE Final    Comment:        The GeneXpert MRSA Assay (FDA approved for NASAL specimens only), is one component of a comprehensive MRSA colonization surveillance program. It is not intended to diagnose MRSA infection nor to guide or monitor treatment for MRSA infections.     RADIOLOGY:  No results found.   Management plans discussed with the patient, family and they are in agreement.  CODE STATUS:     Code Status Orders        Start     Ordered   10/19/17 0114  Full code  Continuous     10/19/17 0113    Code Status History    Date Active Date Inactive Code Status Order ID Comments User Context   This patient has a current code status but no historical code status.      TOTAL TIME TAKING CARE OF THIS PATIENT: 37 minutes.    Haylea Schlichting M.D on 10/20/2017 at 1:50 PM  Between 7am to 6pm - Pager - [phone]  After 6pm go to [email or web address] - password [email or web address] [facility]  [facility] [place] [facility]  Office  [phone]  CC: Primary care physician; Lyndon Code, MD   Note: This dictation was prepared with Dragon dictation along with smaller phrase technology. Any transcriptional errors that result from this process are unintentional.

## 2017-10-24 LAB — CULTURE, BLOOD (ROUTINE X 2)
Culture: NO GROWTH
Culture: NO GROWTH
Special Requests: ADEQUATE

## 2018-01-02 DIAGNOSIS — M170 Bilateral primary osteoarthritis of knee: Secondary | ICD-10-CM | POA: Insufficient documentation

## 2018-02-13 ENCOUNTER — Other Ambulatory Visit: Payer: Self-pay | Admitting: Internal Medicine

## 2018-02-16 ENCOUNTER — Other Ambulatory Visit: Payer: Self-pay | Admitting: Family Medicine

## 2018-02-16 DIAGNOSIS — Z1231 Encounter for screening mammogram for malignant neoplasm of breast: Secondary | ICD-10-CM

## 2018-03-13 ENCOUNTER — Ambulatory Visit
Admission: RE | Admit: 2018-03-13 | Discharge: 2018-03-13 | Disposition: A | Discharge status: Home / Self Care | Payer: Medicare Other | Source: Ambulatory Visit | Attending: Family Medicine | Admitting: Family Medicine

## 2018-03-13 DIAGNOSIS — Z1231 Encounter for screening mammogram for malignant neoplasm of breast: Secondary | ICD-10-CM | POA: Diagnosis not present

## 2018-11-17 IMAGING — DX DG CHEST 1V PORT
1 series · 2 of 2 positions shown · non-contrast
Comparison: None

CLINICAL DATA: Dyspnea

EXAM:
PORTABLE CHEST 1 VIEW

[Series 1: chest ap · 0.14mm/px · 2 of 2 slices shown]
[im 1/2]
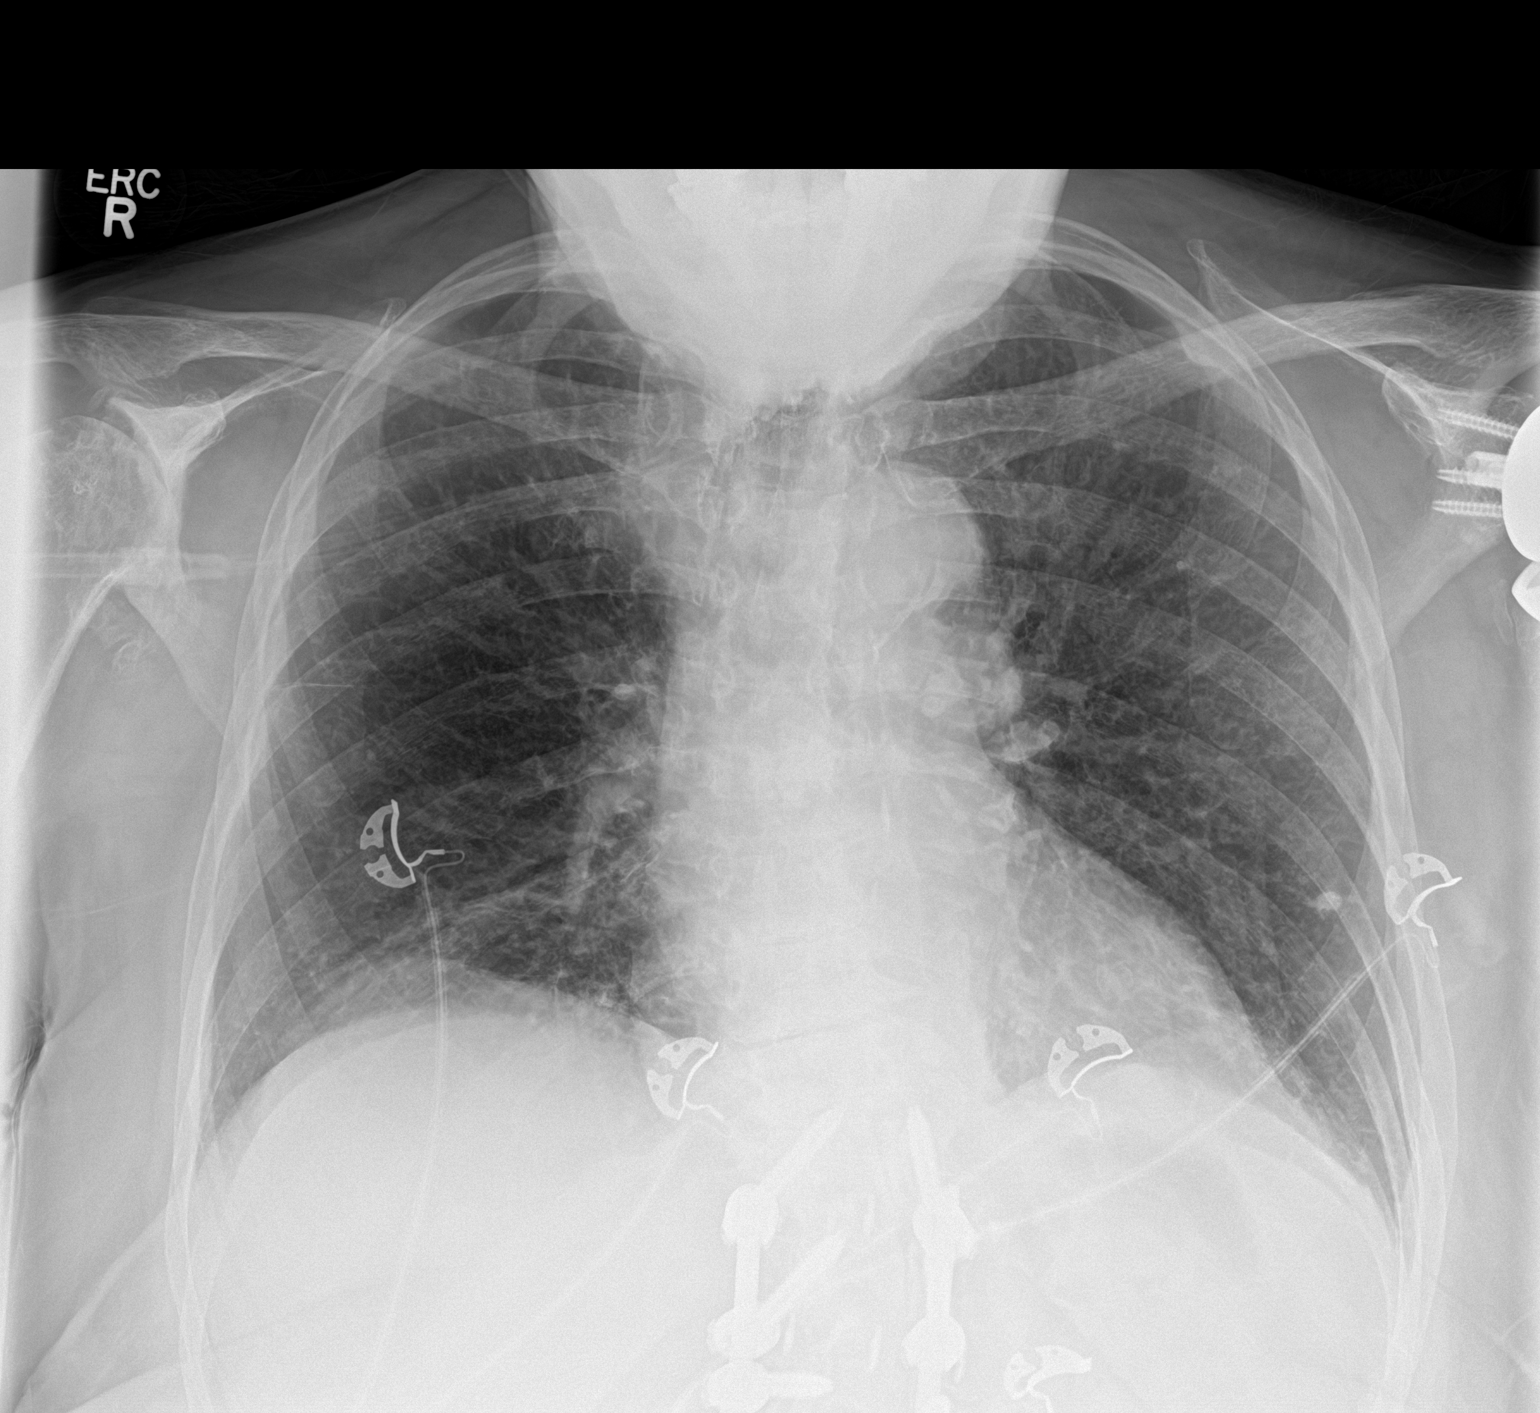
[im 2/2]
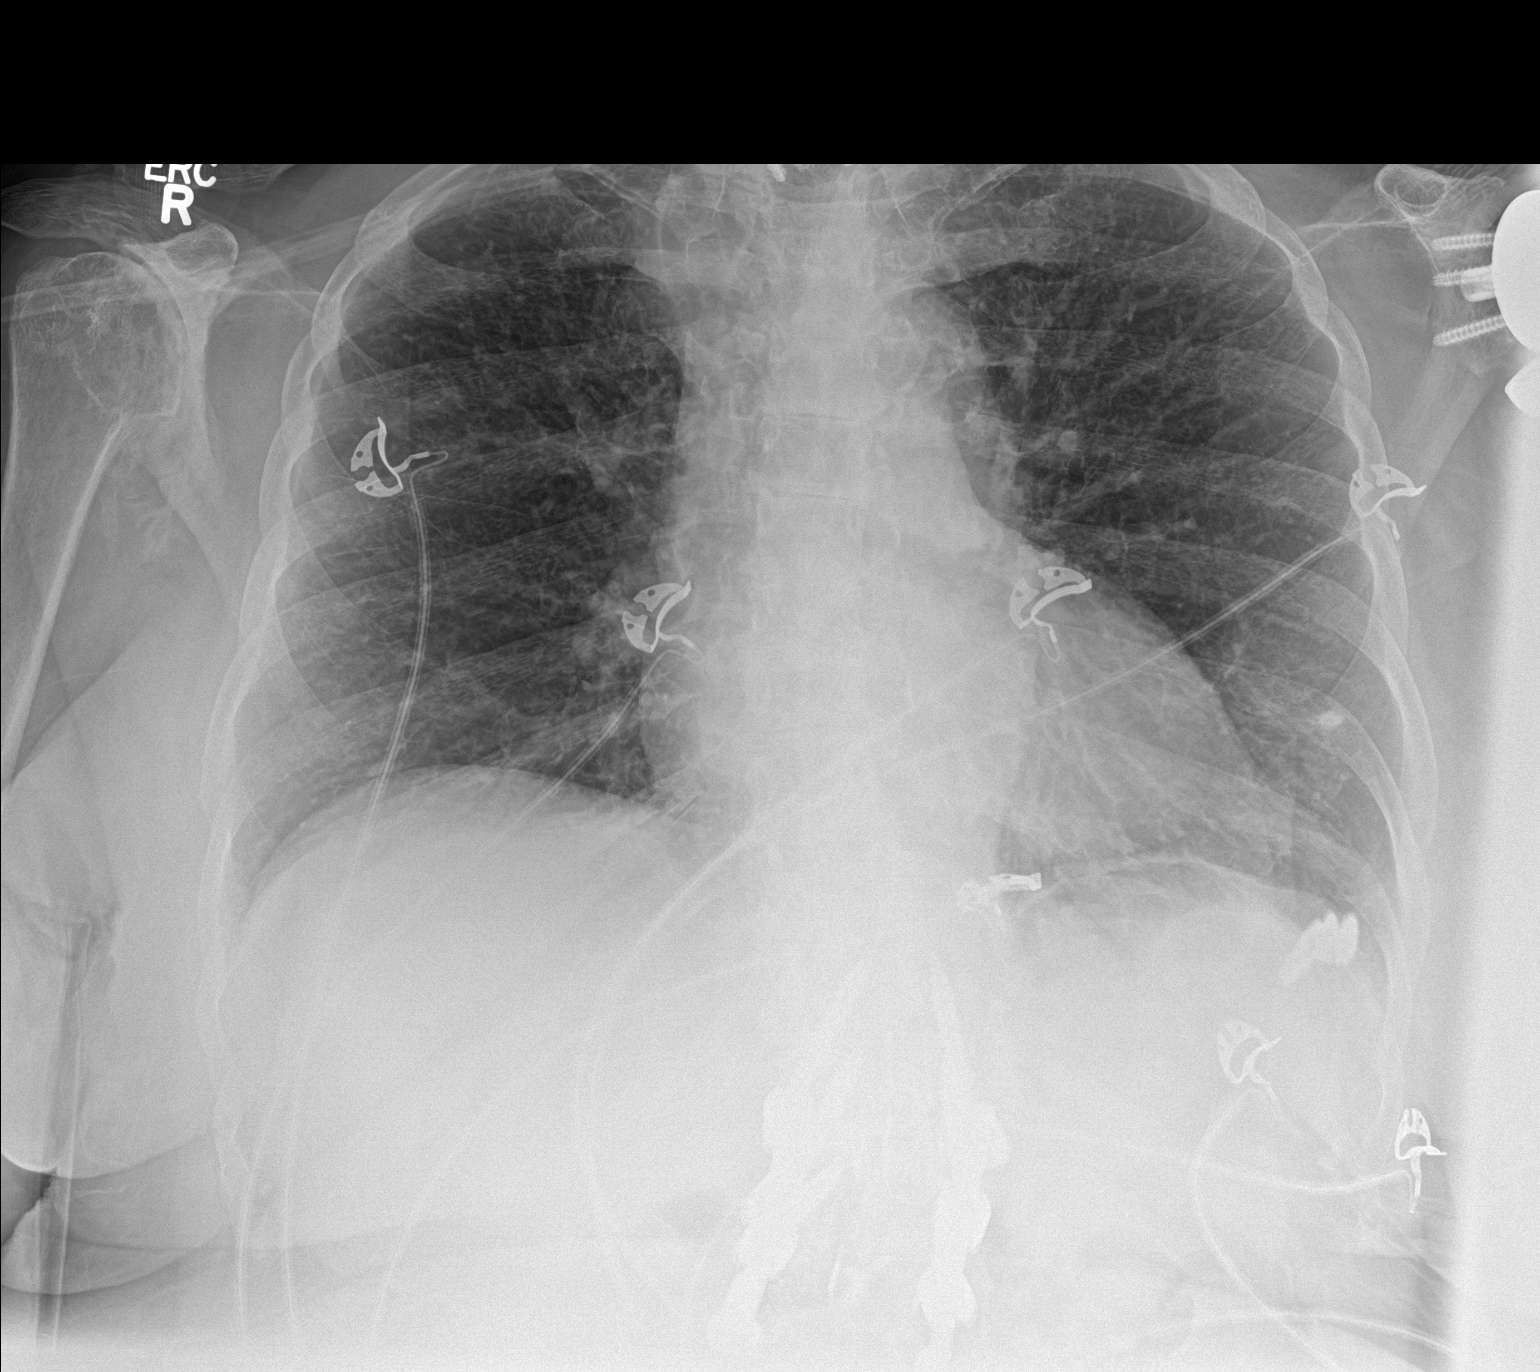

[2 of 2 positions shown; findings below may reference images not displayed]

FINDINGS: Heart is top-normal in size. There is aortic atherosclerosis with
uncoiling of the thoracic aorta. Mild diffuse interstitial
prominence is noted which may reflect a nonspecific interstitial
lung disease or age related interstitial fibrotic change. Calcified
granuloma is seen in the left mid lung with minimal left basilar
atelectasis. No pneumonic consolidation or CHF. No effusion or
pneumothorax. Cervical and thoracolumbar fixation hardware is noted.
Partially imaged left reverse shoulder arthroplasty with marked
remodeling of the native right glenohumeral joint from
osteoarthritis. Subchondral and subcortical cystic changes are noted
of the humeral head.
IMPRESSION: 1. Aortic atherosclerosis without acute pulmonary disease.
2. Calcified granuloma in the left mid lung.
3. Mild diffuse interstitial prominence, nonspecific but may reflect
chronic interstitial disease or fibrosis.

## 2019-03-17 ENCOUNTER — Other Ambulatory Visit: Payer: Self-pay | Admitting: Family Medicine

## 2019-03-17 DIAGNOSIS — Z1231 Encounter for screening mammogram for malignant neoplasm of breast: Secondary | ICD-10-CM

## 2019-03-19 DIAGNOSIS — M75121 Complete rotator cuff tear or rupture of right shoulder, not specified as traumatic: Secondary | ICD-10-CM | POA: Insufficient documentation

## 2019-03-19 DIAGNOSIS — M12811 Other specific arthropathies, not elsewhere classified, right shoulder: Secondary | ICD-10-CM | POA: Insufficient documentation

## 2019-03-22 ENCOUNTER — Other Ambulatory Visit: Payer: Self-pay | Admitting: Surgery

## 2019-03-22 DIAGNOSIS — M12811 Other specific arthropathies, not elsewhere classified, right shoulder: Secondary | ICD-10-CM

## 2019-03-22 DIAGNOSIS — M75121 Complete rotator cuff tear or rupture of right shoulder, not specified as traumatic: Secondary | ICD-10-CM

## 2019-03-25 ENCOUNTER — Ambulatory Visit
Admission: RE | Admit: 2019-03-25 | Discharge: 2019-03-25 | Disposition: A | Discharge status: Home / Self Care | Payer: Medicare Other | Source: Ambulatory Visit | Attending: Surgery | Admitting: Surgery

## 2019-03-25 ENCOUNTER — Other Ambulatory Visit: Payer: Self-pay

## 2019-03-25 DIAGNOSIS — M12811 Other specific arthropathies, not elsewhere classified, right shoulder: Secondary | ICD-10-CM | POA: Diagnosis present

## 2019-03-25 DIAGNOSIS — M75121 Complete rotator cuff tear or rupture of right shoulder, not specified as traumatic: Secondary | ICD-10-CM | POA: Diagnosis not present

## 2019-06-15 ENCOUNTER — Ambulatory Visit
Admission: RE | Admit: 2019-06-15 | Discharge: 2019-06-15 | Disposition: A | Discharge status: Home / Self Care | Payer: Medicare Other | Source: Ambulatory Visit | Attending: Family Medicine | Admitting: Family Medicine

## 2019-06-15 ENCOUNTER — Other Ambulatory Visit: Payer: Self-pay

## 2019-06-15 DIAGNOSIS — Z1231 Encounter for screening mammogram for malignant neoplasm of breast: Secondary | ICD-10-CM | POA: Diagnosis present

## 2019-06-18 ENCOUNTER — Other Ambulatory Visit: Payer: Self-pay | Admitting: Family Medicine

## 2019-06-18 DIAGNOSIS — N6320 Unspecified lump in the left breast, unspecified quadrant: Secondary | ICD-10-CM

## 2019-06-18 DIAGNOSIS — R928 Other abnormal and inconclusive findings on diagnostic imaging of breast: Secondary | ICD-10-CM

## 2019-06-28 ENCOUNTER — Ambulatory Visit
Admission: RE | Admit: 2019-06-28 | Discharge: 2019-06-28 | Disposition: A | Discharge status: Home / Self Care | Payer: Medicare Other | Source: Ambulatory Visit | Attending: Family Medicine | Admitting: Family Medicine

## 2019-06-28 ENCOUNTER — Other Ambulatory Visit: Payer: Self-pay

## 2019-06-28 DIAGNOSIS — R928 Other abnormal and inconclusive findings on diagnostic imaging of breast: Secondary | ICD-10-CM | POA: Insufficient documentation

## 2019-06-28 DIAGNOSIS — N6320 Unspecified lump in the left breast, unspecified quadrant: Secondary | ICD-10-CM | POA: Diagnosis present

## 2020-03-02 ENCOUNTER — Other Ambulatory Visit: Payer: Self-pay

## 2020-03-02 ENCOUNTER — Other Ambulatory Visit: Payer: Self-pay | Admitting: Student

## 2020-03-02 ENCOUNTER — Ambulatory Visit
Admission: RE | Admit: 2020-03-02 | Discharge: 2020-03-02 | Disposition: A | Discharge status: Home / Self Care | Payer: Medicare Other | Source: Ambulatory Visit | Attending: Student | Admitting: Student

## 2020-03-02 ENCOUNTER — Other Ambulatory Visit
Admission: RE | Admit: 2020-03-02 | Discharge: 2020-03-02 | Disposition: A | Discharge status: Home / Self Care | Payer: Medicare Other | Source: Ambulatory Visit | Attending: *Deleted | Admitting: *Deleted

## 2020-03-02 DIAGNOSIS — M7989 Other specified soft tissue disorders: Secondary | ICD-10-CM | POA: Insufficient documentation

## 2020-03-02 DIAGNOSIS — M79604 Pain in right leg: Secondary | ICD-10-CM

## 2020-03-02 LAB — COMPREHENSIVE METABOLIC PANEL
ALT: 37 U/L (ref 0–44)
AST: 36 U/L (ref 15–41)
Albumin: 4 g/dL (ref 3.5–5.0)
Alkaline Phosphatase: 81 U/L (ref 38–126)
Anion gap: 8 (ref 5–15)
BUN: 29 mg/dL — ABNORMAL HIGH (ref 8–23)
CO2: 25 mmol/L (ref 22–32)
Calcium: 9.3 mg/dL (ref 8.9–10.3)
Chloride: 105 mmol/L (ref 98–111)
Creatinine, Ser: 1.19 mg/dL — ABNORMAL HIGH (ref 0.44–1.00)
GFR calc Af Amer: 51 mL/min — ABNORMAL LOW (ref >60)
GFR calc non Af Amer: 44 mL/min — ABNORMAL LOW (ref >60)
Glucose, Bld: 86 mg/dL (ref 70–99)
Potassium: 3.9 mmol/L (ref 3.5–5.1)
Sodium: 138 mmol/L (ref 135–145)
Total Bilirubin: 0.9 mg/dL (ref 0.3–1.2)
Total Protein: 6.2 g/dL — ABNORMAL LOW (ref 6.5–8.1)

## 2020-03-02 LAB — C-REACTIVE PROTEIN: CRP: 0.9 mg/dL (ref <1.0)

## 2020-03-02 LAB — CBC WITH DIFFERENTIAL/PLATELET
Abs Immature Granulocytes: 0.02 10*3/uL (ref 0.00–0.07)
Basophils Absolute: 0 10*3/uL (ref 0.0–0.1)
Basophils Relative: 0 %
Eosinophils Absolute: 0.3 10*3/uL (ref 0.0–0.5)
Eosinophils Relative: 5 %
HCT: 37.3 % (ref 36.0–46.0)
Hemoglobin: 12 g/dL (ref 12.0–15.0)
Immature Granulocytes: 0 %
Lymphocytes Relative: 19 %
Lymphs Abs: 1.3 10*3/uL (ref 0.7–4.0)
MCH: 30.9 pg (ref 26.0–34.0)
MCHC: 32.2 g/dL (ref 30.0–36.0)
MCV: 96.1 fL (ref 80.0–100.0)
Monocytes Absolute: 0.7 10*3/uL (ref 0.1–1.0)
Monocytes Relative: 11 %
Neutro Abs: 4.5 10*3/uL (ref 1.7–7.7)
Neutrophils Relative %: 65 %
Platelets: 204 10*3/uL (ref 150–400)
RBC: 3.88 MIL/uL (ref 3.87–5.11)
RDW: 13.9 % (ref 11.5–15.5)
WBC: 6.9 10*3/uL (ref 4.0–10.5)
nRBC: 0 % (ref 0.0–0.2)

## 2020-03-02 LAB — SEDIMENTATION RATE: Sed Rate: 19 mm/hr (ref 0–30)

## 2020-04-04 ENCOUNTER — Encounter (INDEPENDENT_AMBULATORY_CARE_PROVIDER_SITE_OTHER): Payer: Self-pay

## 2020-04-04 ENCOUNTER — Encounter (INDEPENDENT_AMBULATORY_CARE_PROVIDER_SITE_OTHER): Payer: Self-pay | Admitting: Vascular Surgery

## 2020-04-04 ENCOUNTER — Ambulatory Visit (INDEPENDENT_AMBULATORY_CARE_PROVIDER_SITE_OTHER): Payer: Medicare Other | Admitting: Vascular Surgery

## 2020-04-04 ENCOUNTER — Other Ambulatory Visit: Payer: Self-pay

## 2020-04-04 VITALS — BP 139/73 | HR 80 | Resp 16 | Ht 62.0 in | Wt 165.0 lb

## 2020-04-04 DIAGNOSIS — I890 Lymphedema, not elsewhere classified: Secondary | ICD-10-CM | POA: Insufficient documentation

## 2020-04-04 DIAGNOSIS — E785 Hyperlipidemia, unspecified: Secondary | ICD-10-CM

## 2020-04-04 DIAGNOSIS — M7989 Other specified soft tissue disorders: Secondary | ICD-10-CM | POA: Insufficient documentation

## 2020-04-04 DIAGNOSIS — I10 Essential (primary) hypertension: Secondary | ICD-10-CM

## 2020-04-04 NOTE — Assessment & Plan Note (Signed)
blood pressure control important in reducing the progression of atherosclerotic disease. On appropriate oral medications.  

## 2020-04-04 NOTE — Assessment & Plan Note (Signed)
lipid control important in reducing the progression of atherosclerotic disease. Continue statin therapy  

## 2020-04-04 NOTE — Progress Notes (Signed)
Patient ID: Madison Benson, female   DOB: [birth year], 79 y.o.   MRN: [id number]  Chief Complaint  Patient presents with  . New Patient (Initial Visit)    ref. carter rle edema, erythema and pain     HPI Madison Benson is a 79 y.o. female.  I am asked to see the patient by D. Montez Morita, PA-C for evaluation of pain swelling and recent cellulitis of the right leg.  She was treated with antibiotics for 2 weeks as well as Unna boots for the last 2 weeks.  The redness and erythema of the right leg is markedly improved as is the tenderness.  She has had chronic pain and swelling for many years.  This is the leg she injured multiple times from the age of 8 on.  She has had discoloration this leg and swelling for many years.  She developed discoloration of the left leg after some treatment with steroids several years ago that has never really gone away.  The left leg really does not swell or cause her much pain that she notices.  She is gotten some Velcro compression system that helped the right leg somewhat, but after the cellulitis she has been wrapped in an Unna boot for the last 2 weeks and was on oral antibiotics.  No fevers or chills.  No open wounds or infection.  No known history of DVT, but she has had multiple injuries and problems with that right leg in the past.     Past Medical History:  Diagnosis Date  . Chronic back pain   . HLD (hyperlipidemia)   . Hypertension     Past Surgical History:  Procedure Laterality Date  . BACK SURGERY       Family History  Problem Relation Age of Onset  . Hypertension Other   No bleeding disorders, clotting disorders, autoimmune diseases, or aneurysms  Social History   Tobacco Use  . Smoking status: Never Smoker  . Smokeless tobacco: Never Used  Substance Use Topics  . Alcohol use: Yes    Alcohol/week: 5.0 standard drinks    Types: 5 Glasses of wine per week  . Drug use: No     Allergies  Allergen Reactions  . Nsaids  Diarrhea and Nausea And Vomiting  . Sulfa Antibiotics Diarrhea and Nausea And Vomiting    Current Outpatient Medications  Medication Sig Dispense Refill  . albuterol (PROVENTIL HFA;VENTOLIN HFA) 108 (90 Base) MCG/ACT inhaler Inhale 2 puffs into the lungs every 6 (six) hours as needed for wheezing or shortness of breath. 1 Inhaler 2  . amoxicillin-clavulanate (AUGMENTIN) 875-125 MG tablet Take 1 tablet by mouth 2 (two) times daily. X 10 days 20 tablet 0  . aspirin 81 MG EC tablet Take 81 mg by mouth daily.    . Calcium-Vitamin D-Vitamin K 750-500-40 MG-UNT-MCG TABS Take 1 tablet by mouth daily.    . cholecalciferol (VITAMIN D) 1000 units tablet Take 1,000 Units by mouth 2 (two) times daily.    . DULoxetine (CYMBALTA) 60 MG capsule Take 60 mg by mouth at bedtime.    . fentaNYL (DURAGESIC - DOSED MCG/HR) 50 MCG/HR Place 50 mcg onto the skin every 3 (three) days.    . Ferrous Sulfate Dried (EQ SLOW-RELEASE IRON) 45 MG TBCR Take 45 mg by mouth daily.    Marland Kitchen lactobacillus acidophilus (BACID) TABS tablet Take 1 tablet by mouth daily.    Marland Kitchen losartan (COZAAR) 100 MG tablet Take 50 mg by mouth daily.    Marland Kitchen  Magnesium 100 MG TABS Take 100 mg by mouth daily.    Marland Kitchen omeprazole (PRILOSEC) 20 MG capsule Take 20 mg by mouth daily.    . Oxycodone HCl 10 MG TABS Take 10 mg by mouth 3 (three) times daily.    . simvastatin (ZOCOR) 40 MG tablet Take 40 mg by mouth daily.    Marland Kitchen tiZANidine (ZANAFLEX) 4 MG tablet Take 4 mg by mouth 2 (two) times daily.     No current facility-administered medications for this visit.      REVIEW OF SYSTEMS (Negative unless checked)  Constitutional: [] Weight loss  [] Fever  [] Chills Cardiac: [] Chest pain   [] Chest pressure   [] Palpitations   [] Shortness of breath when laying flat   [] Shortness of breath at rest   [] Shortness of breath with exertion. Vascular:  [x] Pain in legs with walking   [x] Pain in legs at rest   [] Pain in legs when laying flat   [] Claudication   [] Pain in feet when  walking  [] Pain in feet at rest  [] Pain in feet when laying flat   [] History of DVT   [] Phlebitis   [x] Swelling in legs   [] Varicose veins   [] Non-healing ulcers Pulmonary:   [] Uses home oxygen   [] Productive cough   [] Hemoptysis   [] Wheeze  [] COPD   [] Asthma Neurologic:  [] Dizziness  [] Blackouts   [] Seizures   [] History of stroke   [] History of TIA  [] Aphasia   [] Temporary blindness   [] Dysphagia   [] Weakness or numbness in arms   [] Weakness or numbness in legs Musculoskeletal:  [x] Arthritis   [] Joint swelling   [x] Joint pain   [x] Low back pain Hematologic:  [] Easy bruising  [] Easy bleeding   [] Hypercoagulable state   [] Anemic  [] Hepatitis Gastrointestinal:  [] Blood in stool   [] Vomiting blood  [] Gastroesophageal reflux/heartburn   [] Abdominal pain Genitourinary:  [] Chronic kidney disease   [] Difficult urination  [] Frequent urination  [] Burning with urination   [] Hematuria Skin:  [] Rashes   [] Ulcers   [] Wounds Psychological:  [] History of anxiety   []  History of major depression.    Physical Exam BP 139/73 (BP Location: Right Arm)   Pulse 80   Resp 16   Ht 5\' 2"  (1.575 m)   Wt 165 lb (74.8 kg)   BMI 30.18 kg/m  Gen:  WD/WN, NAD.  Appears younger than stated age Head: [place]/AT, No temporalis wasting.  Ear/Nose/Throat: Hearing grossly intact, nares w/o erythema or drainage, oropharynx w/o Erythema/Exudate Eyes: Conjunctiva clear, sclera non-icteric  Neck: trachea midline.  No JVD.  Pulmonary:  Good air movement, respirations not labored, no use of accessory muscles  Cardiac: RRR, no JVD Vascular:  Vessel Right Left  Radial Palpable Palpable                          DP  1+  2+  PT  1+  1+   Gastrointestinal:. No masses, surgical incisions, or scars. Musculoskeletal: M/S 5/5 throughout.  Extremities without ischemic changes.  She does have arthritic changes to her hands as well as significant swelling of her right knee presumably from arthritis.  2+ right lower extremity edema, no  significant left lower extremity edema.  Stasis dermatitis changes are present in both lower extremities and are moderate in nature. Neurologic: Sensation grossly intact in extremities.  Symmetrical.  Speech is fluent. Motor exam as listed above. Psychiatric: Judgment intact, Mood & affect appropriate for pt's clinical situation. Dermatologic: No rashes or ulcers noted.  No cellulitis or open wounds.    Radiology No results found.  Labs Recent Results (from the past 2160 hour(s))  CBC with Differential/Platelet     Status: None   Collection Time: 03/02/20  4:09 PM  Result Value Ref Range   WBC 6.9 4.0 - 10.5 K/uL   RBC 3.88 3.87 - 5.11 MIL/uL   Hemoglobin 12.0 12.0 - 15.0 g/dL   HCT 37.3 36.0 - 46.0 %   MCV 96.1 80.0 - 100.0 fL   MCH 30.9 26.0 - 34.0 pg   MCHC 32.2 30.0 - 36.0 g/dL   RDW 13.9 11.5 - 15.5 %   Platelets 204 150 - 400 K/uL   nRBC 0.0 0.0 - 0.2 %   Neutrophils Relative % 65 %   Neutro Abs 4.5 1.7 - 7.7 K/uL   Lymphocytes Relative 19 %   Lymphs Abs 1.3 0.7 - 4.0 K/uL   Monocytes Relative 11 %   Monocytes Absolute 0.7 0.1 - 1.0 K/uL   Eosinophils Relative 5 %   Eosinophils Absolute 0.3 0.0 - 0.5 K/uL   Basophils Relative 0 %   Basophils Absolute 0.0 0.0 - 0.1 K/uL   Immature Granulocytes 0 %   Abs Immature Granulocytes 0.02 0.00 - 0.07 K/uL    Comment: Performed at [facility], [address] [place], [place] [address]  Comprehensive metabolic panel     Status: Abnormal   Collection Time: 03/02/20  4:09 PM  Result Value Ref Range   Sodium 138 135 - 145 mmol/L   Potassium 3.9 3.5 - 5.1 mmol/L   Chloride 105 98 - 111 mmol/L   CO2 25 22 - 32 mmol/L   Glucose, Bld 86 70 - 99 mg/dL    Comment: Glucose reference range applies only to samples taken after fasting for at least 8 hours.   BUN 29 (H) 8 - 23 mg/dL   Creatinine, Ser 1.19 (H) 0.44 - 1.00 mg/dL   Calcium 9.3 8.9 - 10.3 mg/dL   Total Protein 6.2 (L) 6.5 - 8.1 g/dL   Albumin 4.0 3.5 - 5.0 g/dL    AST 36 15 - 41 U/L   ALT 37 0 - 44 U/L   Alkaline Phosphatase 81 38 - 126 U/L   Total Bilirubin 0.9 0.3 - 1.2 mg/dL   GFR calc non Af Amer 44 (L) >60 mL/min   GFR calc Af Amer 51 (L) >60 mL/min   Anion gap 8 5 - 15    Comment: Performed at [facility], [address] [place], [place] [address]  Sedimentation rate     Status: None   Collection Time: 03/02/20  4:09 PM  Result Value Ref Range   Sed Rate 19 0 - 30 mm/hr    Comment: Performed at [facility], [address] [place], [place] [address]  C-reactive protein     Status: None   Collection Time: 03/02/20  4:09 PM  Result Value Ref Range   CRP 0.9 <1.0 mg/dL    Comment: Performed at [facility] [address], [place], [place] [address]    Assessment/Plan:  HLD (hyperlipidemia) lipid control important in reducing the progression of atherosclerotic disease. Continue statin therapy   HTN (hypertension) blood pressure control important in reducing the progression of atherosclerotic disease. On appropriate oral medications.   Swelling of limb I have had a long discussion with the patient regarding swelling and why it  causes symptoms.  Patient will begin wearing graduated compression  stockings class 1 (20-30 mmHg) on a daily basis a prescription was given. The patient will  beginning wearing the stockings first thing in the morning and removing them in the evening. The patient is instructed specifically not to sleep in the stockings.  In addition, behavioral modification will be initiated.  This will include frequent elevation, use of over the counter pain medications and exercise such as walking. I have reviewed systemic causes for chronic edema such as liver, kidney and cardiac etiologies.  The patient denies problems with these organ systems.   Consideration for a lymph pump will also be made based upon the effectiveness of conservative therapy.  This would help to improve the edema control and  prevent sequela such as ulcers and infections  Patient should undergo duplex ultrasound of the venous system to ensure that DVT or reflux is not present. The patient will follow-up with me after the ultrasound.    Lymphedema The patient clearly has at least stage II lymphedema from chronic scarring and lymphatic channels with multiple previous injuries and surgeries to the right leg and ankle.  The right leg has had recent cellulitis and has chronic swelling and pain.  We are going to do a venous assessment, but clearly a lymphedema pump will be an excellent adjuvant therapy to help with her pain and swelling.  We discussed the pathophysiology and natural history of lymphedema and how it is a permanent condition.  She voices her understanding.  We will see her back in the next several weeks with her venous work-up.      Festus Barren 04/04/2020, 12:22 PM   This note was created with Dragon medical transcription system.  Any errors from dictation are unintentional.

## 2020-04-04 NOTE — Patient Instructions (Signed)

## 2020-04-04 NOTE — Assessment & Plan Note (Signed)
The patient clearly has at least stage II lymphedema from chronic scarring and lymphatic channels with multiple previous injuries and surgeries to the right leg and ankle.  The right leg has had recent cellulitis and has chronic swelling and pain.  We are going to do a venous assessment, but clearly a lymphedema pump will be an excellent adjuvant therapy to help with her pain and swelling.  We discussed the pathophysiology and natural history of lymphedema and how it is a permanent condition.  She voices her understanding.  We will see her back in the next several weeks with her venous work-up.

## 2020-04-04 NOTE — Assessment & Plan Note (Signed)

## 2020-04-19 ENCOUNTER — Encounter (INDEPENDENT_AMBULATORY_CARE_PROVIDER_SITE_OTHER): Payer: Self-pay | Admitting: Nurse Practitioner

## 2020-04-19 ENCOUNTER — Other Ambulatory Visit: Payer: Self-pay

## 2020-04-19 ENCOUNTER — Ambulatory Visit (INDEPENDENT_AMBULATORY_CARE_PROVIDER_SITE_OTHER): Payer: Medicare Other

## 2020-04-19 ENCOUNTER — Ambulatory Visit (INDEPENDENT_AMBULATORY_CARE_PROVIDER_SITE_OTHER): Payer: Medicare Other | Admitting: Nurse Practitioner

## 2020-04-19 VITALS — BP 112/66 | HR 74 | Resp 16 | Wt 161.0 lb

## 2020-04-19 DIAGNOSIS — M7989 Other specified soft tissue disorders: Secondary | ICD-10-CM

## 2020-04-19 DIAGNOSIS — I83811 Varicose veins of right lower extremities with pain: Secondary | ICD-10-CM

## 2020-04-19 DIAGNOSIS — I890 Lymphedema, not elsewhere classified: Secondary | ICD-10-CM

## 2020-04-19 DIAGNOSIS — I10 Essential (primary) hypertension: Secondary | ICD-10-CM | POA: Diagnosis not present

## 2020-04-19 DIAGNOSIS — K219 Gastro-esophageal reflux disease without esophagitis: Secondary | ICD-10-CM | POA: Insufficient documentation

## 2020-04-24 ENCOUNTER — Telehealth (INDEPENDENT_AMBULATORY_CARE_PROVIDER_SITE_OTHER): Payer: Self-pay | Admitting: Vascular Surgery

## 2020-04-24 ENCOUNTER — Encounter (INDEPENDENT_AMBULATORY_CARE_PROVIDER_SITE_OTHER): Payer: Self-pay | Admitting: Nurse Practitioner

## 2020-04-24 NOTE — Telephone Encounter (Signed)
Swelling in the toes is not indicative or worrisome for a DVT, which the patient did not have at her recent ultrasound.  If I remember the patient uses compression wraps.  These wraps stop at the ankle and may be making it difficult to for fluid to return from the toes.  If she is using these wraps she needs to get some compression socks and continue to elevate and within a few days the swelling in her toes should decrease.

## 2020-04-24 NOTE — Progress Notes (Addendum)
Subjective:    Patient ID: Madison Benson, female    DOB: 14-Oct-1941, 79 y.o.   MRN: [id number] Chief Complaint  Patient presents with  . Follow-up    ultrasound follow up    Madison Benson is a 79 y.o. female.  Patient presents today for review of noninvasive studies.  The patient had a recent bout of cellulitis as well as pain and tenderness.  The patient has had chronic pain and swelling for years.  She has had multiple injuries in the right lower extremity since the age of 70.  There is also been discoloration and swelling for years as well to.  Left lower extremity is also developed some discoloration as well.  The right lower extremity gives her the majority of the issues.  The patient has recently utilized a Farrow wrap which is helpful for controlling swelling.  Following the patient's most recent episode of cellulitis she was placed in wraps for 2 weeks as well as oral antibiotics in this has been helpful.  Patient denies any previous history of DVT in either extremity.  She denies any open ulcerations or wounds.  Today the patient underwent right lower extremity venous reflux study.  There is no evidence of DVT seen in the right lower extremity.  There is no evidence of superficial venous thrombosis in the right lower extremity.  There is venous reflux noted in the common femoral vein of the right lower extremity.  The great saphenous vein also has reflux at the saphenofemoral junction extending to the distal thigh.  Vein diameters measure from 0.54 cm to 0.6 cm.       Review of Systems  Cardiovascular: Positive for leg swelling.  Skin: Positive for color change.  All other systems reviewed and are negative.      Objective:   Physical Exam Vitals reviewed.  Cardiovascular:     Rate and Rhythm: Normal rate and regular rhythm.     Pulses: Normal pulses.  Pulmonary:     Effort: Pulmonary effort is normal.  Neurological:     Mental Status: She is alert and oriented  to person, place, and time.  Psychiatric:        Mood and Affect: Mood normal.        Behavior: Behavior normal.        Thought Content: Thought content normal.        Judgment: Judgment normal.     BP 112/66 (BP Location: Left Arm)   Pulse 74   Resp 16   Wt 161 lb (73 kg)   BMI 29.45 kg/m   Past Medical History:  Diagnosis Date  . Chronic back pain   . HLD (hyperlipidemia)   . Hypertension     Social History   Socioeconomic History  . Marital status: Married    Spouse name: Not on file  . Number of children: Not on file  . Years of education: Not on file  . Highest education level: Not on file  Occupational History  . Not on file  Tobacco Use  . Smoking status: Never Smoker  . Smokeless tobacco: Never Used  Substance and Sexual Activity  . Alcohol use: Yes    Alcohol/week: 5.0 standard drinks    Types: 5 Glasses of wine per week  . Drug use: No  . Sexual activity: Not on file  Other Topics Concern  . Not on file  Social History Narrative  . Not on file   Social Determinants of Health  Financial Resource Strain:   . Difficulty of Paying Living Expenses:   Food Insecurity:   . Worried About Programme researcher, broadcasting/film/video in the Last Year:   . Barista in the Last Year:   Transportation Needs:   . Freight forwarder (Medical):   Marland Kitchen Lack of Transportation (Non-Medical):   Physical Activity:   . Days of Exercise per Week:   . Minutes of Exercise per Session:   Stress:   . Feeling of Stress :   Social Connections:   . Frequency of Communication with Friends and Family:   . Frequency of Social Gatherings with Friends and Family:   . Attends Religious Services:   . Active Member of Clubs or Organizations:   . Attends Banker Meetings:   Marland Kitchen Marital Status:   Intimate Partner Violence:   . Fear of Current or Ex-Partner:   . Emotionally Abused:   Marland Kitchen Physically Abused:   . Sexually Abused:     Past Surgical History:  Procedure Laterality  Date  . BACK SURGERY      Family History  Problem Relation Age of Onset  . Hypertension Other     Allergies  Allergen Reactions  . Latex Rash    After band aid left for couple days After band aid left for couple days After band aid left for couple days   . Nsaids Diarrhea and Nausea And Vomiting  . Sulfa Antibiotics Diarrhea and Nausea And Vomiting  . Diclofenac Sodium Diarrhea  . Naproxen Diarrhea    Any anti-inflammatory causes severe diarrhea Any anti-inflammatory causes severe diarrhea Any anti-inflammatory causes severe diarrhea        Assessment & Plan:   1. Lymphedema I have had a long discussion with the patient regarding swelling and why it  causes symptoms.  Patient will begin wearing graduated compression stockings class 1 (20-30 mmHg) on a daily basis a prescription was given. The patient will  beginning wearing the stockings first thing in the morning and removing them in the evening. The patient is instructed specifically not to sleep in the stockings.   In addition, behavioral modification will be initiated.  This will include frequent elevation, use of over the counter pain medications and exercise such as walking.  I have reviewed systemic causes for chronic edema such as liver, kidney and cardiac etiologies.  The patient denies problems with these organ systems.    Consideration for a lymph pump will also be made based upon the effectiveness of conservative therapy.  This would help to improve the edema control and prevent sequela such as ulcers and infections we will also evaluate the left lower extremity as well the venous reflux study.  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  I have had a long discussion with the patient regarding  varicose veins and why they  cause symptoms.  Patient will begin wearing graduated compression stockings class 1 on a daily basis, beginning first thing in the morning and removing them in the evening. The patient is instructed specifically not to sleep in the stockings.    The patient  will also begin using over-the-counter analgesics such as Motrin 600 mg po TID to help control the symptoms.    In addition, behavioral modification including elevation during the day will be initiated.  Pending the results of these changes the  patient will be reevaluated in three months.  We will also evaluate the left lower extremity for chronic venous insufficiency as well.  Further plans will be based on the ultrasound results and whether conservative therapies are successful at eliminating the pain and swelling.   3. Essential hypertension Continue antihypertensive medications as already ordered, these medications have been reviewed and there are no changes at this time.    Current Outpatient Medications on File Prior to Visit  Medication Sig Dispense Refill  . albuterol (PROVENTIL HFA;VENTOLIN HFA) 108 (90 Base) MCG/ACT inhaler Inhale 2 puffs into the lungs every 6 (six) hours as needed for wheezing or shortness of breath. 1 Inhaler 2  . aspirin 81 MG EC tablet Take 81 mg by mouth daily.    . Calcium-Vitamin D-Vitamin K 726-203-55 MG-UNT-MCG TABS Take 1 tablet by mouth daily.    . cholecalciferol (VITAMIN D) 1000 units tablet Take 1,000 Units by mouth 2 (two) times daily.    . DULoxetine (CYMBALTA) 60 MG capsule Take 60 mg by mouth at bedtime.    . fentaNYL (DURAGESIC - DOSED MCG/HR) 50 MCG/HR Place 50 mcg onto the skin every 3 (three) days.    . Ferrous Sulfate Dried (EQ SLOW-RELEASE IRON) 45 MG TBCR Take 45 mg by mouth daily.    Marland Kitchen lactobacillus acidophilus (BACID) TABS tablet Take 1 tablet by mouth daily.    Marland Kitchen losartan (COZAAR) 100 MG tablet Take 50 mg by mouth daily.    . Magnesium 100 MG TABS Take 100 mg by mouth daily.      Marland Kitchen omeprazole (PRILOSEC) 20 MG capsule Take 20 mg by mouth daily.    . Oxycodone HCl 10 MG TABS Take 10 mg by mouth 3 (three) times daily.    . simvastatin (ZOCOR) 40 MG tablet Take 40 mg by mouth daily.    Marland Kitchen tiZANidine (ZANAFLEX) 4 MG tablet Take 4 mg by mouth 2 (two) times daily.    Marland Kitchen amoxicillin-clavulanate (AUGMENTIN) 875-125 MG tablet Take 1 tablet by mouth 2 (two) times daily. X 10 days (Patient not taking: Reported on 04/19/2020) 20 tablet 0   No current facility-administered medications on file prior to visit.    There are no Patient Instructions on file for this visit. No follow-ups on file.   Kris Hartmann, NP

## 2020-04-24 NOTE — Telephone Encounter (Signed)
Called stating she has just recently started  experiencing swelling in her ankles down to her toes. Patient was last seen 04-19-20 with LE Ven Reflux study with a F/U with FB. She requested to be seen either today or tomorrow. Please advise.

## 2020-04-24 NOTE — Telephone Encounter (Signed)
The patient should be advised to continue with conservative therapy that was discussed such as elevating her lower extremity above the level of her heart and utilizing compression.  If that does not improve her swelling over the next several days the patient can come and be placed in unna wraps.

## 2020-04-24 NOTE — Telephone Encounter (Signed)
I called the pts  husband back Mr. Lanier Prude and made him aware of the Np's instructions.

## 2020-04-24 NOTE — Telephone Encounter (Signed)
I made the pts husband Madison Benson aware of you instructions and he said that the the swelling has moved from her right leg to her to the toes on her tight foot and that she has been doing her compression and her ankle is now fine.please advise.

## 2020-05-15 DIAGNOSIS — M65339 Trigger finger, unspecified middle finger: Secondary | ICD-10-CM | POA: Insufficient documentation

## 2020-07-19 ENCOUNTER — Ambulatory Visit (INDEPENDENT_AMBULATORY_CARE_PROVIDER_SITE_OTHER): Payer: Medicare Other

## 2020-07-19 ENCOUNTER — Encounter (INDEPENDENT_AMBULATORY_CARE_PROVIDER_SITE_OTHER): Payer: Self-pay | Admitting: Nurse Practitioner

## 2020-07-19 ENCOUNTER — Other Ambulatory Visit: Payer: Self-pay

## 2020-07-19 ENCOUNTER — Other Ambulatory Visit (INDEPENDENT_AMBULATORY_CARE_PROVIDER_SITE_OTHER): Payer: Self-pay | Admitting: Nurse Practitioner

## 2020-07-19 ENCOUNTER — Ambulatory Visit (INDEPENDENT_AMBULATORY_CARE_PROVIDER_SITE_OTHER): Payer: Medicare Other | Admitting: Nurse Practitioner

## 2020-07-19 VITALS — BP 106/69 | HR 69 | Resp 16 | Wt 161.0 lb

## 2020-07-19 DIAGNOSIS — I890 Lymphedema, not elsewhere classified: Secondary | ICD-10-CM

## 2020-07-19 DIAGNOSIS — M170 Bilateral primary osteoarthritis of knee: Secondary | ICD-10-CM

## 2020-07-19 DIAGNOSIS — I83811 Varicose veins of right lower extremities with pain: Secondary | ICD-10-CM

## 2020-07-19 DIAGNOSIS — I10 Essential (primary) hypertension: Secondary | ICD-10-CM

## 2020-07-24 ENCOUNTER — Encounter (INDEPENDENT_AMBULATORY_CARE_PROVIDER_SITE_OTHER): Payer: Self-pay | Admitting: Nurse Practitioner

## 2020-07-24 NOTE — Progress Notes (Signed)
Subjective:    Patient ID: Madison Benson, female    DOB: 07-19-1941, 79 y.o.   MRN: [id number] Chief Complaint  Patient presents with  . Follow-up    ultrasound follow up    The patient returns to the office for followup evaluation regarding leg swelling.  The swelling has persisted and the pain associated with swelling continues. There have not been any interval development of a ulcerations or wounds.  Since the previous visit the patient has been wearing graduated compression stockings and has noted little if any improvement in the lymphedema. The patient has been using compression routinely morning until night.  The patient also states elevation during the day and exercise is being done too.   Today noninvasive study showed no evidence of DVT or superficial venous thrombosis in the left lower extremity.  No evidence of superficial venous reflux seen in the left short saphenous vein.  There is evidence of superficial venous reflux in the great saphenous vein at the proximal calf.  At the previous office visit the patient has studies on her right lower extremity.  There is no evidence of DVT seen in the right lower extremity.  There is no evidence of superficial venous thrombosis in the right lower extremity.  There is venous reflux noted in the common femoral vein of the right lower extremity.  The great saphenous vein also has reflux at the saphenofemoral junction extending to the distal thigh.  Vein diameters measure from 0.54 cm to 0.6 cm.   Review of Systems  Cardiovascular: Positive for leg swelling.  All other systems reviewed and are negative.      Objective:   Physical Exam Vitals reviewed.  Constitutional:      Appearance: Normal appearance.  HENT:     Head: Normocephalic.  Cardiovascular:     Rate and Rhythm: Normal rate and regular rhythm.     Pulses: Normal pulses.     Heart sounds: Normal heart sounds.  Pulmonary:     Effort: Pulmonary effort is normal.      Breath sounds: Normal breath sounds.  Skin:    Comments: Dermal thickening stasis dermatitis bilaterally  Neurological:     Mental Status: She is alert and oriented to person, place, and time.  Psychiatric:        Mood and Affect: Mood normal.        Behavior: Behavior normal.        Thought Content: Thought content normal.        Judgment: Judgment normal.     BP 106/69 (BP Location: Left Arm)   Pulse 69   Resp 16   Wt 161 lb (73 kg)   BMI 29.45 kg/m   Past Medical History:  Diagnosis Date  . Chronic back pain   . HLD (hyperlipidemia)   . Hypertension     Social History   Socioeconomic History  . Marital status: Married    Spouse name: Not on file  . Number of children: Not on file  . Years of education: Not on file  . Highest education level: Not on file  Occupational History  . Not on file  Tobacco Use  . Smoking status: Never Smoker  . Smokeless tobacco: Never Used  Vaping Use  . Vaping Use: Never used  Substance and Sexual Activity  . Alcohol use: Yes    Alcohol/week: 5.0 standard drinks    Types: 5 Glasses of wine per week  . Drug use: No  . Sexual activity:  Not on file  Other Topics Concern  . Not on file  Social History Narrative  . Not on file   Social Determinants of Health   Financial Resource Strain:   . Difficulty of Paying Living Expenses:   Food Insecurity:   . Worried About Programme researcher, broadcasting/film/video in the Last Year:   . Barista in the Last Year:   Transportation Needs:   . Freight forwarder (Medical):   Marland Kitchen Lack of Transportation (Non-Medical):   Physical Activity:   . Days of Exercise per Week:   . Minutes of Exercise per Session:   Stress:   . Feeling of Stress :   Social Connections:   . Frequency of Communication with Friends and Family:   . Frequency of Social Gatherings with Friends and Family:   . Attends Religious Services:   . Active Member of Clubs or Organizations:   . Attends Banker Meetings:   Marland Kitchen  Marital Status:   Intimate Partner Violence:   . Fear of Current or Ex-Partner:   . Emotionally Abused:   Marland Kitchen Physically Abused:   . Sexually Abused:     Past Surgical History:  Procedure Laterality Date  . BACK SURGERY      Family History  Problem Relation Age of Onset  . Hypertension Other     Allergies  Allergen Reactions  . Latex Rash    After band aid left for couple days After band aid left for couple days After band aid left for couple days   . Nsaids Diarrhea and Nausea And Vomiting  . Sulfa Antibiotics Diarrhea and Nausea And Vomiting  . Diclofenac Sodium Diarrhea  . Naproxen Diarrhea    Any anti-inflammatory causes severe diarrhea Any anti-inflammatory causes severe diarrhea Any anti-inflammatory causes severe diarrhea        Assessment & Plan:   1. Lymphedema Recommend:  No surgery or intervention at this point in time.    I have reviewed my previous discussion with the patient regarding swelling and why it causes symptoms.  Patient will continue wearing graduated compression stockings class 1 (20-30 mmHg) on a daily basis. The patient will  beginning wearing the stockings first thing in the morning and removing them in the evening. The patient is instructed specifically not to sleep in the stockings.    In addition, behavioral modification including several periods of elevation of the lower extremities during the day will be continued.  This was reviewed with the patient during the initial visit.  The patient will also continue routine exercise, especially walking on a daily basis as was discussed during the initial visit.    Despite conservative treatments of at least 4 weeks, including graduated compression therapy class 1 and behavioral modification including exercise and elevation the patient  has not obtained adequate control of the lymphedema.  The patient still has stage 3 lymphedema and therefore, I believe that a lymph pump should be added to  improve the control of the patient's lymphedema.  Additionally, a lymph pump is warranted because it will reduce the risk of cellulitis and ulceration in the future.  Patient should follow-up in six months    2. Essential hypertension Continue antihypertensive medications as already ordered, these medications have been reviewed and there are no changes at this time.   3. Primary osteoarthritis of both knees Continue NSAID medications as already ordered, these medications have been reviewed and there are no changes at this time.  Continued activity  and therapy was stressed.    Current Outpatient Medications on File Prior to Visit  Medication Sig Dispense Refill  . albuterol (PROVENTIL HFA;VENTOLIN HFA) 108 (90 Base) MCG/ACT inhaler Inhale 2 puffs into the lungs every 6 (six) hours as needed for wheezing or shortness of breath. 1 Inhaler 2  . aspirin 81 MG EC tablet Take 81 mg by mouth daily.    . Calcium-Vitamin D-Vitamin K 750-500-40 MG-UNT-MCG TABS Take 1 tablet by mouth daily.    . cholecalciferol (VITAMIN D) 1000 units tablet Take 1,000 Units by mouth 2 (two) times daily.    . DULoxetine (CYMBALTA) 60 MG capsule Take 60 mg by mouth at bedtime.    . fentaNYL (DURAGESIC - DOSED MCG/HR) 50 MCG/HR Place 50 mcg onto the skin every 3 (three) days.    . Ferrous Sulfate Dried (EQ SLOW-RELEASE IRON) 45 MG TBCR Take 45 mg by mouth daily.    Marland Kitchen lactobacillus acidophilus (BACID) TABS tablet Take 1 tablet by mouth daily.    Marland Kitchen losartan (COZAAR) 100 MG tablet Take 50 mg by mouth daily.    . Magnesium 100 MG TABS Take 100 mg by mouth daily.    Marland Kitchen omeprazole (PRILOSEC) 20 MG capsule Take 20 mg by mouth daily.    . Oxycodone HCl 10 MG TABS Take 10 mg by mouth 3 (three) times daily.    . simvastatin (ZOCOR) 40 MG tablet Take 40 mg by mouth daily.    Marland Kitchen tiZANidine (ZANAFLEX) 4 MG tablet Take 4 mg by mouth 2 (two) times daily.    Marland Kitchen amoxicillin-clavulanate (AUGMENTIN) 875-125 MG tablet Take 1 tablet by  mouth 2 (two) times daily. X 10 days (Patient not taking: Reported on 04/19/2020) 20 tablet 0   No current facility-administered medications on file prior to visit.    There are no Patient Instructions on file for this visit. No follow-ups on file.   Georgiana Spinner, NP

## 2020-07-27 IMAGING — MG DIGITAL DIAGNOSTIC UNILATERAL LEFT MAMMOGRAM WITH TOMO AND CAD
6 series · 6 of 18 positions shown · non-contrast
Comparison: Previous exam(s).

CLINICAL DATA: Recall from screening to evaluate possible left
breast mass.

EXAM:
DIGITAL DIAGNOSTIC UNILATERAL LEFT MAMMOGRAM WITH CAD AND TOMO

[L MLO synth-2D (1 of 2)]
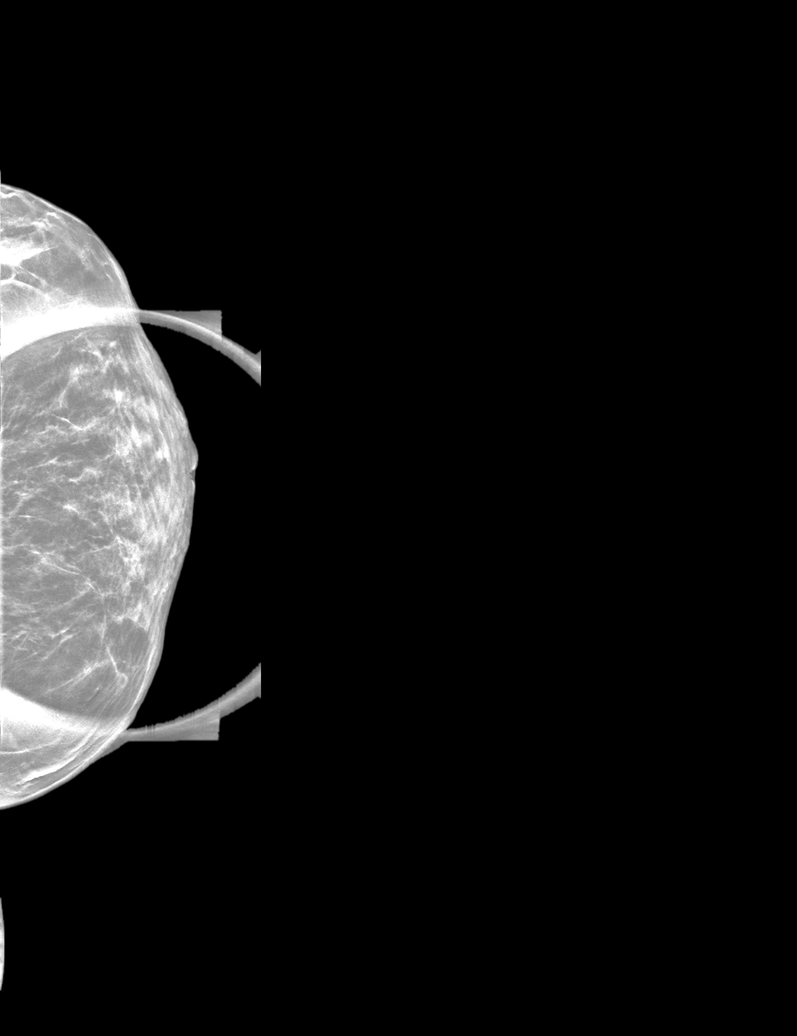

[L MLO synth-2D (2 of 2)]
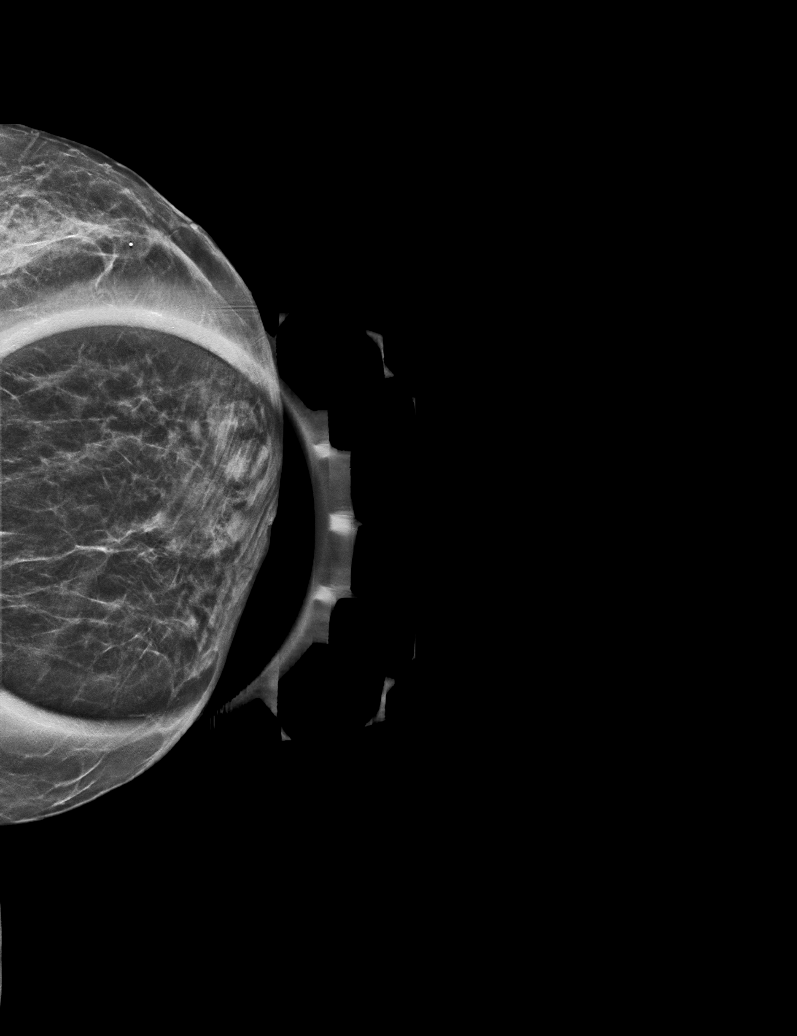

[L CC synth-2D]
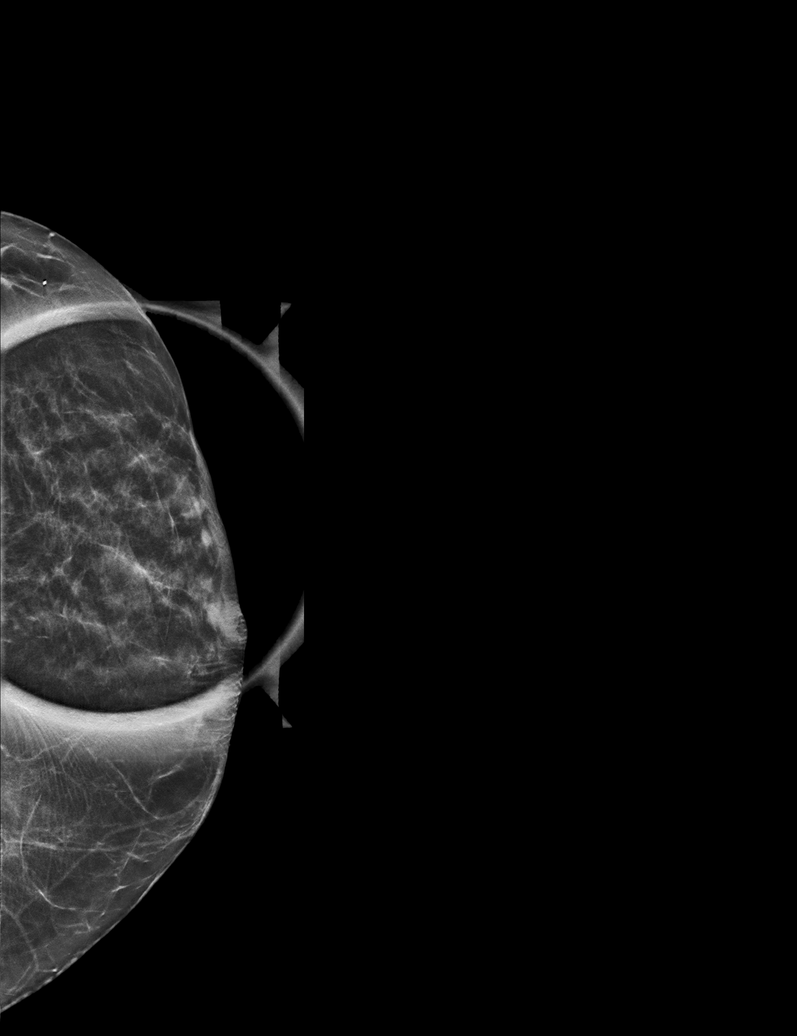

[L CC tomo · tomo slice 19/38.0]
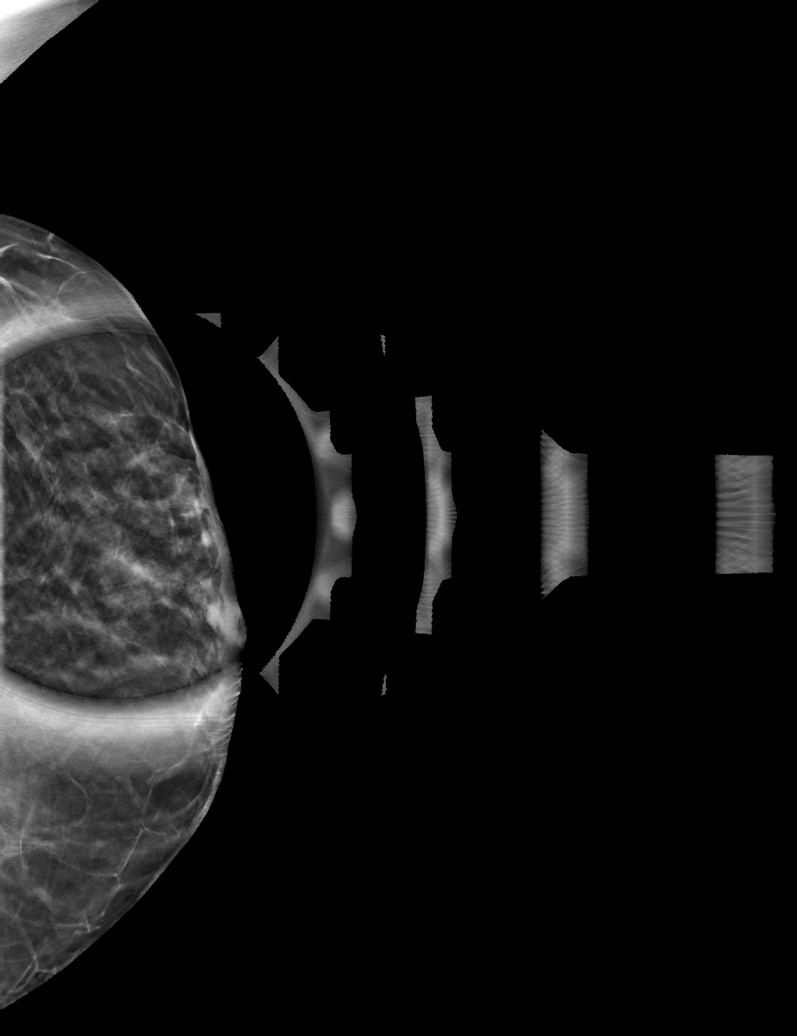

[L MLO tomo (1 of 2) · tomo slice 19/37.0]
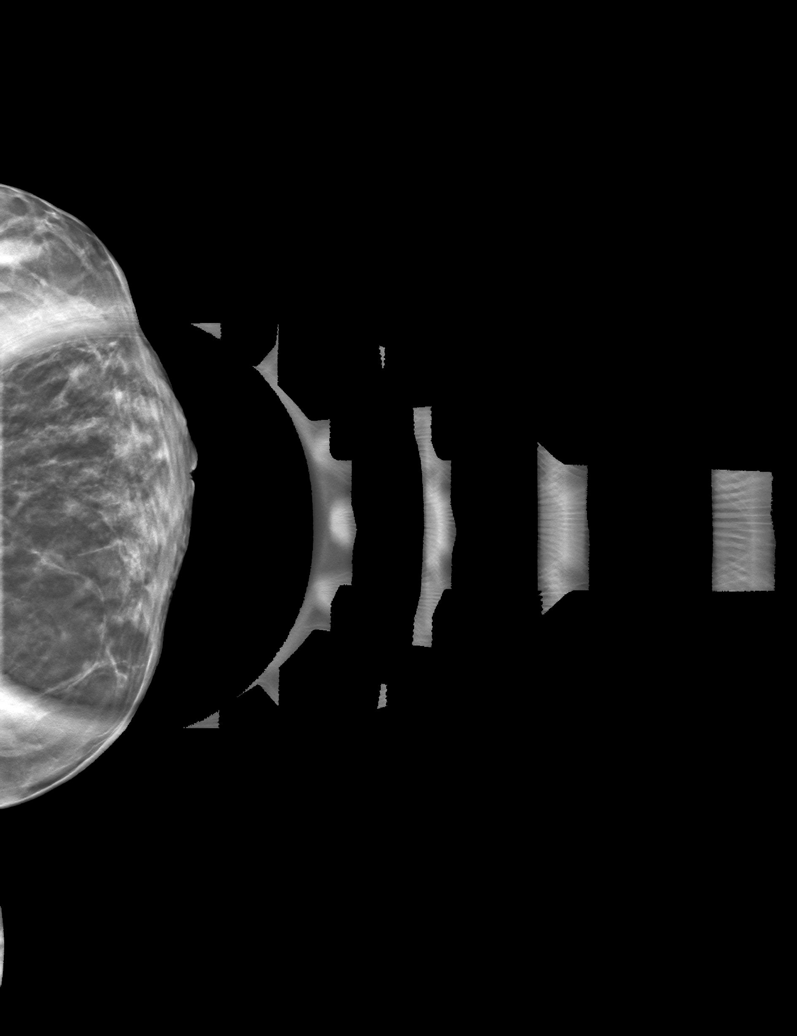

[L MLO tomo (2 of 2) · tomo slice 21/41.0]
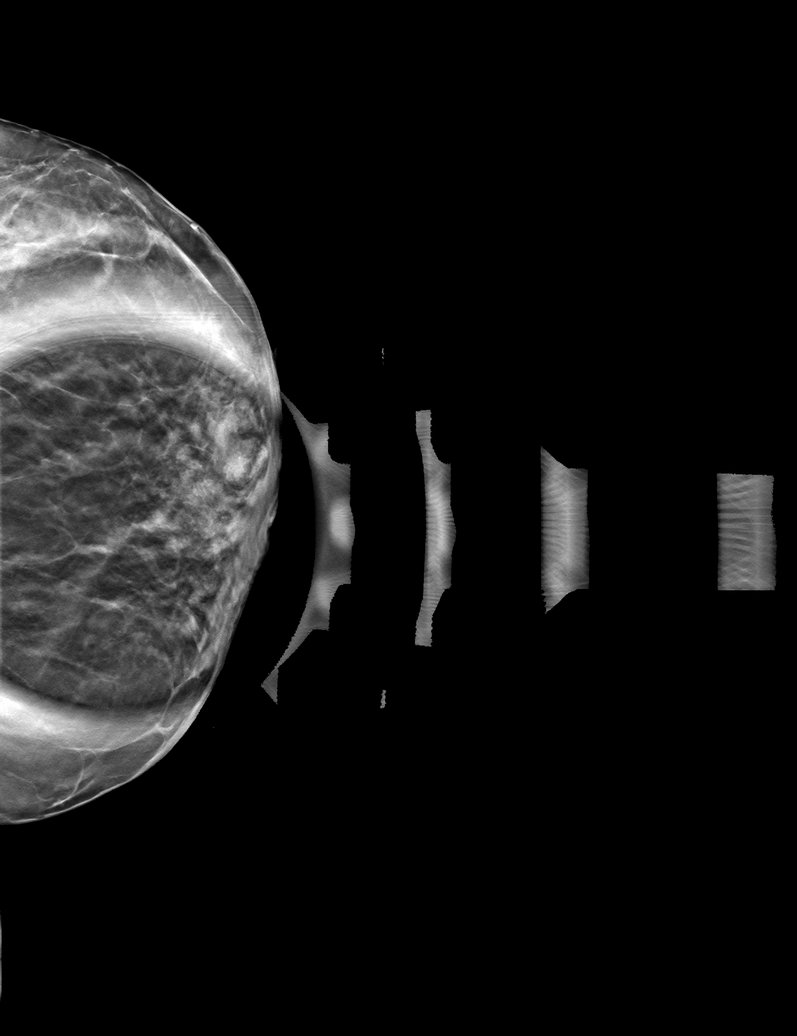

[6 of 18 positions shown; findings below may reference images not displayed]

ACR Breast Density Category b: There are scattered areas of
fibroglandular density.
FINDINGS: Additional spot compression tomographic images demonstrate no focal
mass over the outer mid to lower left breast.

Mammographic images were processed with CAD.
IMPRESSION: No focal mass over the outer mid to lower left periareolar region.

RECOMMENDATION:
Recommend continued annual bilateral screening mammographic
follow-up.

I have discussed the findings and recommendations with the patient.
Results were also provided in writing at the conclusion of the
visit. If applicable, a reminder letter will be sent to the patient
regarding the next appointment.

BI-RADS CATEGORY  1: Negative.

## 2020-12-15 ENCOUNTER — Other Ambulatory Visit: Payer: Self-pay | Admitting: Family Medicine

## 2020-12-15 DIAGNOSIS — Z1231 Encounter for screening mammogram for malignant neoplasm of breast: Secondary | ICD-10-CM

## 2021-01-16 ENCOUNTER — Ambulatory Visit (INDEPENDENT_AMBULATORY_CARE_PROVIDER_SITE_OTHER): Payer: Medicare Other | Admitting: Vascular Surgery

## 2021-01-16 ENCOUNTER — Other Ambulatory Visit: Payer: Self-pay

## 2021-01-16 VITALS — BP 110/68 | HR 74 | Ht 60.0 in | Wt 166.0 lb

## 2021-01-16 DIAGNOSIS — I890 Lymphedema, not elsewhere classified: Secondary | ICD-10-CM

## 2021-01-16 DIAGNOSIS — E785 Hyperlipidemia, unspecified: Secondary | ICD-10-CM | POA: Diagnosis not present

## 2021-01-16 DIAGNOSIS — I10 Essential (primary) hypertension: Secondary | ICD-10-CM

## 2021-01-16 DIAGNOSIS — I872 Venous insufficiency (chronic) (peripheral): Secondary | ICD-10-CM

## 2021-01-16 NOTE — Patient Instructions (Signed)
Lymphedema  Lymphedema is swelling that is caused by the abnormal collection of lymph in the tissues under the skin. Lymph is excess fluid from the tissues in your body that is removed through the lymphatic system. This system is part of your body's defense system (immune system) and includes lymph nodes and lymph vessels. The lymph vessels collect and carry the excess fluid, fats, proteins, and waste from the tissues of the body to the bloodstream. This system also works to clean and remove bacteria and waste products from the body. Lymphedema occurs when the lymphatic system is blocked. When the lymph vessels or lymph nodes are blocked or damaged, lymph does not drain properly. This causes an abnormal buildup of lymph, which leads to swelling in the affected area. This may include the trunk area, or an arm or leg. Lymphedema cannot be cured by medicines, but various methods can be used to help reduce the swelling. What are the causes? The cause of this condition depends on the type of lymphedema that you have.  Primary lymphedema is caused by the absence of lymph vessels or having abnormal lymph vessels at birth.  Secondary lymphedema occurs when lymph vessels are blocked or damaged. Secondary lymphedema is more common. Common causes of lymph vessel blockage include: ? Skin infection, such as cellulitis. ? Infection by parasites (filariasis). ? Injury. ? Radiation therapy. ? Cancer. ? Formation of scar tissue. ? Surgery. What are the signs or symptoms? Symptoms of this condition include:  Swelling of the arm or leg.  A heavy or tight feeling in the arm or leg.  Swelling of the feet, toes, or fingers. Shoes or rings may fit more tightly than before.  Redness of the skin over the affected area.  Limited movement of the affected limb.  Sensitivity to touch or discomfort in the affected limb. How is this diagnosed? This condition may be diagnosed based on:  Your symptoms and medical  history.  A physical exam.  Bioimpedance spectroscopy. In this test, painless electrical currents are used to measure fluid levels in your body.  Imaging tests, such as: ? MRI. ? CT scan. ? Duplex ultrasound. This test uses sound waves to produce images of the vessels and the blood flow on a screen. ? Lymphoscintigraphy. In this test, a low dose of a radioactive substance is injected to trace the flow of lymph through your lymph vessels. ? Lymphangiography. In this test, a contrast dye is injected into the lymph vessel to help show blockages. How is this treated? If an underlying condition is causing the lymphedema, that condition will be treated. For example, antibiotic medicines may be used to treat an infection. Treatment for this condition will depend on the cause of your lymphedema. Treatment may include:  Complete decongestive therapy (CDT). This is done by a certified lymphedema therapist to reduce fluid congestion. This therapy includes: ? Skin care. ? Compression wrapping of the affected area. ? Manual lymph drainage. This is a special massage technique that promotes lymph drainage out of a limb. ? Specific exercises. Certain exercises can help fluid move out of the affected limb.  Compression. Various methods may be used to apply pressure to the affected limb to reduce the swelling. They include: ? Wearing compression stockings or sleeves on the affected limb. ? Wrapping the affected limb with special bandages.  Surgery. This is usually done for severe cases only. For example, surgery may be done if you have trouble moving the limb or if the swelling does not   get better with other treatments.   Follow these instructions at home: Self-care  The affected area is more likely to become injured or infected. Take these steps to help prevent infection: ? Keep the affected area clean and dry. ? Use approved creams or lotions to keep the skin moisturized. ? Protect your skin from  cuts:  Use gloves while cooking or gardening.  Do not walk barefoot.  If you shave the affected area, use an electric razor.  Do not wear tight clothes, shoes, or jewelry.  Eat a healthy diet that includes a lot of fruits and vegetables. Activity  Do exercises as told by your health care provider.  Do not sit with your legs crossed.  When possible, keep the affected limb raised (elevated) above the level of your heart.  Avoid carrying things with an arm that is affected by lymphedema. General instructions  Wear compression stockings or sleeves as told by your health care provider.  Note any changes in size of the affected limb. You may be instructed to take regular measurements and keep track of them.  Take over-the-counter and prescription medicines only as told by your health care provider.  If you were prescribed an antibiotic medicine, take or apply it as told by your health care provider. Do not stop using the antibiotic even if you start to feel better or if your condition improves.  Do not use heating pads or ice packs on the affected area.  Avoid having blood draws, IV insertions, or blood pressure checks on the affected limb.  Keep all follow-up visits. This is important. Contact a health care provider if you:  Continue to have swelling in your limb.  Have fluid leaking from the skin of your swollen limb.  Have a cut that does not heal.  Have redness or pain in the affected area.  Develop purplish spots, rash, blisters, or sores (lesions) on your affected limb. Get help right away if you:  Have new swelling in your limb that starts suddenly.  Have shortness of breath or chest pain.  Have a fever or chills. These symptoms may represent a serious problem that is an emergency. Do not wait to see if the symptoms will go away. Get medical help right away. Call your local emergency services (911 in the [place] Do not drive yourself to the  hospital. Summary  Lymphedema is swelling that is caused by the abnormal collection of lymph in the tissues under the skin.  Lymph is fluid from the tissues in your body that is removed through the lymphatic system. This system collects and carries excess fluid, fats, proteins, and wastes from the tissues of the body to the bloodstream.  Lymphedema causes swelling, pain, and redness in the affected area. This may include the trunk area, or an arm or leg.  Treatment for this condition may depend on the cause of your lymphedema. Treatment may include treating the underlying cause, complete decongestive therapy (CDT), compression methods, or surgery. This information is not intended to replace advice given to you by your health care provider. Make sure you discuss any questions you have with your health care provider. Document Revised: 10/11/2020 Document Reviewed: 10/11/2020 Elsevier Patient Education  2021 [employer]  

## 2021-01-16 NOTE — Progress Notes (Signed)
MRN : [id number]  Madison Benson is a 80 y.o. (05-13-1941) female who presents with chief complaint of  Chief Complaint  Patient presents with  . Follow-up    6 Mo no studies  .  History of Present Illness: Patient returns today in follow up of her leg swelling. This has been stable. She has a hard time getting compression socks on and off, so she wears them infrequently. She does elevate her legs.  Her symptoms are stable to slightly better than they were 6 months ago.  Overall she is doing reasonably well.  Current Outpatient Medications  Medication Sig Dispense Refill  . albuterol (PROVENTIL HFA;VENTOLIN HFA) 108 (90 Base) MCG/ACT inhaler Inhale 2 puffs into the lungs every 6 (six) hours as needed for wheezing or shortness of breath. 1 Inhaler 2  . amoxicillin-clavulanate (AUGMENTIN) 875-125 MG tablet Take 1 tablet by mouth 2 (two) times daily. X 10 days 20 tablet 0  . aspirin 81 MG EC tablet Take 81 mg by mouth daily.    . Calcium-Vitamin D-Vitamin K 750-500-40 MG-UNT-MCG TABS Take 1 tablet by mouth daily.    . cholecalciferol (VITAMIN D) 1000 units tablet Take 1,000 Units by mouth 2 (two) times daily.    . DULoxetine (CYMBALTA) 60 MG capsule Take 60 mg by mouth at bedtime.    . fentaNYL (DURAGESIC - DOSED MCG/HR) 50 MCG/HR Place 12 mcg onto the skin every 3 (three) days.    . Ferrous Sulfate Dried 45 MG TBCR Take 45 mg by mouth daily.    Marland Kitchen lactobacillus acidophilus (BACID) TABS tablet Take 1 tablet by mouth daily.    Marland Kitchen losartan (COZAAR) 100 MG tablet Take 50 mg by mouth daily.    . Magnesium 100 MG TABS Take 100 mg by mouth daily.    Marland Kitchen omeprazole (PRILOSEC) 20 MG capsule Take 20 mg by mouth daily.    . Oxycodone HCl 10 MG TABS Take 10 mg by mouth 3 (three) times daily.    . simvastatin (ZOCOR) 40 MG tablet Take 40 mg by mouth daily.    Marland Kitchen tiZANidine (ZANAFLEX) 4 MG tablet Take 4 mg by mouth 2 (two) times daily.     No current facility-administered medications for this visit.     Past Medical History:  Diagnosis Date  . Chronic back pain   . HLD (hyperlipidemia)   . Hypertension     Past Surgical History:  Procedure Laterality Date  . BACK SURGERY       Social History   Tobacco Use  . Smoking status: Never Smoker  . Smokeless tobacco: Never Used  Vaping Use  . Vaping Use: Never used  Substance Use Topics  . Alcohol use: Yes    Alcohol/week: 5.0 standard drinks    Types: 5 Glasses of wine per week  . Drug use: No      Family History  Problem Relation Age of Onset  . Hypertension Other   no bleeding or clotting disorders  Allergies  Allergen Reactions  . Latex Rash    After band aid left for couple days After band aid left for couple days After band aid left for couple days   . Nsaids Diarrhea and Nausea And Vomiting  . Sulfa Antibiotics Diarrhea and Nausea And Vomiting  . Diclofenac Sodium Diarrhea  . Naproxen Diarrhea    Any anti-inflammatory causes severe diarrhea Any anti-inflammatory causes severe diarrhea Any anti-inflammatory causes severe diarrhea     REVIEW OF SYSTEMS (Negative unless checked)  Constitutional: [] ?Weight loss  [] ?Fever  [] ?Chills Cardiac: [] ?Chest pain   [] ?Chest pressure   [] ?Palpitations   [] ?Shortness of breath when laying flat   [] ?Shortness of breath at rest   [] ?Shortness of breath with exertion. Vascular:  [x] ?Pain in legs with walking   [x] ?Pain in legs at rest   [] ?Pain in legs when laying flat   [] ?Claudication   [] ?Pain in feet when walking  [] ?Pain in feet at rest  [] ?Pain in feet when laying flat   [] ?History of DVT   [] ?Phlebitis   [x] ?Swelling in legs   [] ?Varicose veins   [] ?Non-healing ulcers Pulmonary:   [] ?Uses home oxygen   [] ?Productive cough   [] ?Hemoptysis   [] ?Wheeze  [] ?COPD   [] ?Asthma Neurologic:  [] ?Dizziness  [] ?Blackouts   [] ?Seizures   [] ?History of stroke   [] ?History of TIA  [] ?Aphasia   [] ?Temporary blindness   [] ?Dysphagia   [] ?Weakness or numbness in arms   [] ?Weakness  or numbness in legs Musculoskeletal:  [x] ?Arthritis   [] ?Joint swelling   [x] ?Joint pain   [x] ?Low back pain Hematologic:  [] ?Easy bruising  [] ?Easy bleeding   [] ?Hypercoagulable state   [] ?Anemic  [] ?Hepatitis Gastrointestinal:  [] ?Blood in stool   [] ?Vomiting blood  [] ?Gastroesophageal reflux/heartburn   [] ?Abdominal pain Genitourinary:  [] ?Chronic kidney disease   [] ?Difficult urination  [] ?Frequent urination  [] ?Burning with urination   [] ?Hematuria Skin:  [] ?Rashes   [] ?Ulcers   [] ?Wounds Psychological:  [] ?History of anxiety   [] ? History of major depression.   Physical Examination  BP 110/68   Pulse 74   Ht 5' (1.524 m)   Wt 166 lb (75.3 kg)   BMI 32.42 kg/m  Gen:  WD/WN, NAD Head: [place]/AT, No temporalis wasting. Ear/Nose/Throat: Hearing grossly intact, nares w/o erythema or drainage Eyes: Conjunctiva clear. Sclera non-icteric Neck: Supple.  Trachea midline Pulmonary:  Good air movement, no use of accessory muscles.  Cardiac: RRR, no JVD Vascular:  Vessel Right Left  Radial Palpable Palpable                          PT 1+ Palpable 1+ Palpable  DP 1+ Palpable 2+ Palpable   Gastrointestinal: soft, non-tender/non-distended. No guarding/reflex.  Musculoskeletal: M/S 5/5 throughout.  No deformity or atrophy.  Moderate stasis dermatitis changes bilaterally a little worse on the right than the left.  1+ right lower extremity edema, trace left lower extremity edema.  Uses a walker Neurologic: Sensation grossly intact in extremities.  Symmetrical.  Speech is fluent.  Psychiatric: Judgment intact, Mood & affect appropriate for pt's clinical situation. Dermatologic: No rashes or ulcers noted.  No cellulitis or open wounds.       Labs No results found for this or any previous visit (from the past 2160 hour(s)).  Radiology No results found.  Assessment/Plan HLD (hyperlipidemia) lipid control important in reducing the progression of atherosclerotic disease. Continue  statin therapy   HTN (hypertension) blood pressure control important in reducing the progression of atherosclerotic disease. On appropriate oral medications.  Venous (peripheral) insufficiency Previous duplex showed significant reflux in the right leg with a very small amount of reflux in the left leg.  Her symptoms are well controlled so we are not going to plan any intervention at this time.  Return to clinic in 1 year  Lymphedema Symptoms are reasonably well controlled.  No role for any venous intervention at this time.  She is intermittently wearing compression stockings and elevating her legs but as long  as her symptoms are tolerable, this is reasonable.  She has a difficult time getting these on and off.  She will contact our office with any worsening symptoms.  Return to clinic in 1 year.    Festus Barren, MD  01/16/2021 11:50 AM    This note was created with Dragon medical transcription system.  Any errors from dictation are purely unintentional

## 2021-01-16 NOTE — Assessment & Plan Note (Signed)
Symptoms are reasonably well controlled.  No role for any venous intervention at this time.  She is intermittently wearing compression stockings and elevating her legs but as long as her symptoms are tolerable, this is reasonable.  She has a difficult time getting these on and off.  She will contact our office with any worsening symptoms.  Return to clinic in 1 year.

## 2021-01-16 NOTE — Assessment & Plan Note (Signed)
Previous duplex showed significant reflux in the right leg with a very small amount of reflux in the left leg.  Her symptoms are well controlled so we are not going to plan any intervention at this time.  Return to clinic in 1 year

## 2021-01-24 ENCOUNTER — Ambulatory Visit
Admission: RE | Admit: 2021-01-24 | Discharge: 2021-01-24 | Disposition: A | Discharge status: Home / Self Care | Payer: Medicare Other | Source: Ambulatory Visit | Attending: Family Medicine | Admitting: Family Medicine

## 2021-01-24 ENCOUNTER — Other Ambulatory Visit: Payer: Self-pay

## 2021-01-24 DIAGNOSIS — Z1231 Encounter for screening mammogram for malignant neoplasm of breast: Secondary | ICD-10-CM | POA: Diagnosis not present

## 2021-07-17 ENCOUNTER — Telehealth (INDEPENDENT_AMBULATORY_CARE_PROVIDER_SITE_OTHER): Payer: Self-pay | Admitting: Vascular Surgery

## 2021-07-17 NOTE — Telephone Encounter (Signed)
Called stating that left leg has been itching and the right leg is weeping an d has blisters on it. Patient states she would like to come in to be seen. Patient was last seen 01/16/21 with jd no studies. Patient isnt due to come in until 12/2021. Please advise.

## 2021-07-17 NOTE — Telephone Encounter (Signed)
Patient scheduled.

## 2021-07-20 ENCOUNTER — Ambulatory Visit (INDEPENDENT_AMBULATORY_CARE_PROVIDER_SITE_OTHER): Payer: Medicare Other | Admitting: Nurse Practitioner

## 2021-07-20 ENCOUNTER — Other Ambulatory Visit: Payer: Self-pay

## 2021-07-20 VITALS — BP 166/75 | HR 76 | Ht 60.0 in | Wt 171.0 lb

## 2021-07-20 DIAGNOSIS — I890 Lymphedema, not elsewhere classified: Secondary | ICD-10-CM | POA: Diagnosis not present

## 2021-07-20 NOTE — Progress Notes (Signed)
History of Present Illness  There is no documented history at this time  Assessments & Plan   There are no diagnoses linked to this encounter.    Additional instructions  Subjective:  Patient presents with venous ulcer of the Bilateral lower extremity.    Procedure:  3 layer unna wrap was placed Bilateral lower extremity.   Plan:   Follow up in one week.  

## 2021-07-21 ENCOUNTER — Encounter (INDEPENDENT_AMBULATORY_CARE_PROVIDER_SITE_OTHER): Payer: Self-pay | Admitting: Nurse Practitioner

## 2021-07-23 DIAGNOSIS — R9431 Abnormal electrocardiogram [ECG] [EKG]: Secondary | ICD-10-CM | POA: Insufficient documentation

## 2021-07-23 DIAGNOSIS — R0602 Shortness of breath: Secondary | ICD-10-CM | POA: Insufficient documentation

## 2021-07-23 DIAGNOSIS — N183 Chronic kidney disease, stage 3 unspecified: Secondary | ICD-10-CM | POA: Insufficient documentation

## 2021-07-25 ENCOUNTER — Encounter (INDEPENDENT_AMBULATORY_CARE_PROVIDER_SITE_OTHER): Payer: Medicare Other

## 2021-07-25 ENCOUNTER — Encounter (INDEPENDENT_AMBULATORY_CARE_PROVIDER_SITE_OTHER): Payer: Self-pay

## 2021-07-25 ENCOUNTER — Other Ambulatory Visit: Payer: Self-pay

## 2021-07-31 ENCOUNTER — Encounter (INDEPENDENT_AMBULATORY_CARE_PROVIDER_SITE_OTHER): Payer: Medicare Other

## 2021-08-07 ENCOUNTER — Encounter (INDEPENDENT_AMBULATORY_CARE_PROVIDER_SITE_OTHER): Payer: Self-pay | Admitting: Vascular Surgery

## 2021-08-07 ENCOUNTER — Other Ambulatory Visit: Payer: Self-pay

## 2021-08-07 ENCOUNTER — Ambulatory Visit (INDEPENDENT_AMBULATORY_CARE_PROVIDER_SITE_OTHER): Payer: Medicare Other | Admitting: Vascular Surgery

## 2021-08-07 VITALS — BP 201/89 | HR 89 | Resp 18 | Ht 60.0 in | Wt 173.0 lb

## 2021-08-07 DIAGNOSIS — E785 Hyperlipidemia, unspecified: Secondary | ICD-10-CM | POA: Diagnosis not present

## 2021-08-07 DIAGNOSIS — I890 Lymphedema, not elsewhere classified: Secondary | ICD-10-CM

## 2021-08-07 DIAGNOSIS — I10 Essential (primary) hypertension: Secondary | ICD-10-CM | POA: Diagnosis not present

## 2021-08-07 NOTE — Assessment & Plan Note (Signed)
Swelling is under better control.  She is going to try the zippered compression sock as well as the Velcro system in the future.  She has an appointment in several months we will keep that appointment unless she develops worsening swelling or skin breakdown.

## 2021-08-07 NOTE — Progress Notes (Signed)
MRN : [id number]  Madison Benson is a 80 y.o. (03/30/1941) female who presents with chief complaint of  Chief Complaint  Patient presents with   Follow-up  .  History of Present Illness: Patient returns today in follow up of her leg swelling.  She was here several weeks ago with severe swelling which has improved with Unna boots and continuous compression therapy.  She has difficulty getting a standard compression sock on the right leg, so she is currently using the Velcro compression system and it is working well.  She is wearing a compression sock on the left leg.  No significant pain and no fevers or chills.  The skin weeping stopped with treatment after her initial visit few weeks ago.  Current Outpatient Medications  Medication Sig Dispense Refill   albuterol (PROVENTIL HFA;VENTOLIN HFA) 108 (90 Base) MCG/ACT inhaler Inhale 2 puffs into the lungs every 6 (six) hours as needed for wheezing or shortness of breath. 1 Inhaler 2   amoxicillin-clavulanate (AUGMENTIN) 875-125 MG tablet Take 1 tablet by mouth 2 (two) times daily. X 10 days 20 tablet 0   aspirin 81 MG EC tablet Take 81 mg by mouth daily.     Calcium-Vitamin D-Vitamin K 750-500-40 MG-UNT-MCG TABS Take 1 tablet by mouth daily.     cholecalciferol (VITAMIN D) 1000 units tablet Take 1,000 Units by mouth 2 (two) times daily.     DULoxetine (CYMBALTA) 60 MG capsule Take 60 mg by mouth at bedtime.     fentaNYL (DURAGESIC - DOSED MCG/HR) 50 MCG/HR Place 12 mcg onto the skin every 3 (three) days.     Ferrous Sulfate Dried 45 MG TBCR Take 45 mg by mouth daily.     lactobacillus acidophilus (BACID) TABS tablet Take 1 tablet by mouth daily.     losartan (COZAAR) 100 MG tablet Take 50 mg by mouth daily.     Magnesium 100 MG TABS Take 100 mg by mouth daily.     omeprazole (PRILOSEC) 20 MG capsule Take 20 mg by mouth daily.     Oxycodone HCl 10 MG TABS Take 10 mg by mouth 3 (three) times daily.     simvastatin (ZOCOR) 40 MG tablet  Take 40 mg by mouth daily.     tiZANidine (ZANAFLEX) 4 MG tablet Take 4 mg by mouth 2 (two) times daily.     No current facility-administered medications for this visit.    Past Medical History:  Diagnosis Date   Chronic back pain    HLD (hyperlipidemia)    Hypertension     Past Surgical History:  Procedure Laterality Date   BACK SURGERY       Social History   Tobacco Use   Smoking status: Never   Smokeless tobacco: Never  Vaping Use   Vaping Use: Never used  Substance Use Topics   Alcohol use: Yes    Alcohol/week: 5.0 standard drinks    Types: 5 Glasses of wine per week   Drug use: No      Family History  Problem Relation Age of Onset   Hypertension Other      Allergies  Allergen Reactions   Latex Rash    After band aid left for couple days After band aid left for couple days After band aid left for couple days    Nsaids Diarrhea and Nausea And Vomiting   Sulfa Antibiotics Diarrhea and Nausea And Vomiting   Diclofenac Sodium Diarrhea   Naproxen Diarrhea    Any anti-inflammatory  causes severe diarrhea Any anti-inflammatory causes severe diarrhea Any anti-inflammatory causes severe diarrhea     REVIEW OF SYSTEMS (Negative unless checked)   Constitutional: [] Weight loss  [] Fever  [] Chills Cardiac: [] Chest pain   [] Chest pressure   [] Palpitations   [] Shortness of breath when laying flat   [] Shortness of breath at rest   [] Shortness of breath with exertion. Vascular:  [x] Pain in legs with walking   [x] Pain in legs at rest   [] Pain in legs when laying flat   [] Claudication   [] Pain in feet when walking  [] Pain in feet at rest  [] Pain in feet when laying flat   [] History of DVT   [] Phlebitis   [x] Swelling in legs   [] Varicose veins   [] Non-healing ulcers Pulmonary:   [] Uses home oxygen   [] Productive cough   [] Hemoptysis   [] Wheeze  [] COPD   [] Asthma Neurologic:  [] Dizziness  [] Blackouts   [] Seizures   [] History of stroke   [] History of TIA  [] Aphasia    [] Temporary blindness   [] Dysphagia   [] Weakness or numbness in arms   [] Weakness or numbness in legs Musculoskeletal:  [x] Arthritis   [] Joint swelling   [x] Joint pain   [x] Low back pain Hematologic:  [] Easy bruising  [] Easy bleeding   [] Hypercoagulable state   [] Anemic  [] Hepatitis Gastrointestinal:  [] Blood in stool   [] Vomiting blood  [] Gastroesophageal reflux/heartburn   [] Abdominal pain Genitourinary:  [] Chronic kidney disease   [] Difficult urination  [] Frequent urination  [] Burning with urination   [] Hematuria Skin:  [] Rashes   [] Ulcers   [] Wounds Psychological:  [] History of anxiety   []  History of major depression.  Physical Examination  BP (!) 201/89 (BP Location: Right Arm)   Pulse 89   Resp 18   Ht 5' (1.524 m)   Wt 173 lb (78.5 kg)   BMI 33.79 kg/m  Gen:  WD/WN, NAD Head: [place]/AT, No temporalis wasting. Ear/Nose/Throat: Hearing grossly intact, nares w/o erythema or drainage Eyes: Conjunctiva clear. Sclera non-icteric Neck: Supple.  Trachea midline Pulmonary:  Good air movement, no use of accessory muscles.  Cardiac: RRR, no JVD Vascular:  Vessel Right Left  Radial Palpable Palpable                   Musculoskeletal: M/S 5/5 throughout.  Walks with assistance device.  Some ankle and knee deformity on the right.  1+ RLE edema. Neurologic: Sensation grossly intact in extremities.  Symmetrical.  Speech is fluent.  Psychiatric: Judgment intact, Mood & affect appropriate for pt's clinical situation. Dermatologic: No rashes or ulcers noted.  No cellulitis or open wounds.      Labs No results found for this or any previous visit (from the past 2160 hour(s)).  Radiology No results found.  Assessment/Plan HLD (hyperlipidemia) lipid control important in reducing the progression of atherosclerotic disease. Continue statin therapy     HTN (hypertension) blood pressure control important in reducing the progression of atherosclerotic disease. On appropriate oral  medications.  Lymphedema Swelling is under better control.  She is going to try the zippered compression sock as well as the Velcro system in the future.  She has an appointment in several months we will keep that appointment unless she develops worsening swelling or skin breakdown.    , MD  08/07/2021 4:04 PM    This note was created with Dragon medical transcription system.  Any errors from dictation are purely unintentional

## 2021-11-19 DIAGNOSIS — G5601 Carpal tunnel syndrome, right upper limb: Secondary | ICD-10-CM | POA: Insufficient documentation

## 2022-01-15 ENCOUNTER — Ambulatory Visit (INDEPENDENT_AMBULATORY_CARE_PROVIDER_SITE_OTHER): Payer: Medicare Other | Admitting: Vascular Surgery

## 2022-01-18 ENCOUNTER — Ambulatory Visit (INDEPENDENT_AMBULATORY_CARE_PROVIDER_SITE_OTHER): Payer: Medicare Other | Admitting: Vascular Surgery

## 2022-01-18 ENCOUNTER — Other Ambulatory Visit: Payer: Self-pay

## 2022-01-18 ENCOUNTER — Encounter (INDEPENDENT_AMBULATORY_CARE_PROVIDER_SITE_OTHER): Payer: Self-pay | Admitting: Vascular Surgery

## 2022-01-18 VITALS — BP 174/83 | HR 73 | Resp 17 | Ht 60.0 in | Wt 172.0 lb

## 2022-01-18 DIAGNOSIS — I10 Essential (primary) hypertension: Secondary | ICD-10-CM | POA: Diagnosis not present

## 2022-01-18 DIAGNOSIS — E785 Hyperlipidemia, unspecified: Secondary | ICD-10-CM

## 2022-01-18 DIAGNOSIS — M5030 Other cervical disc degeneration, unspecified cervical region: Secondary | ICD-10-CM | POA: Insufficient documentation

## 2022-01-18 DIAGNOSIS — I890 Lymphedema, not elsewhere classified: Secondary | ICD-10-CM

## 2022-01-18 NOTE — Progress Notes (Signed)
MRN : [id number]  Madison Benson is a 81 y.o. (1941/02/21) female who presents with chief complaint of  Chief Complaint  Patient presents with   Follow-up    1 yr no studies  .  History of Present Illness: Patient returns today in follow up of her lymphedema.  Her compression socks are keeping the swelling under reasonably good control, but a recent sprained ankle as well as a nonhealing wound on the right foot has led to increase redness, dry scaling and cracking skin, but no weeping or drainage at this point.  It is also a little more painful on the right side.  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  She has not been regularly using moisturizers on the leg.  She does put her compression socks on every day.  Current Outpatient Medications  Medication Sig Dispense Refill   aspirin 81 MG EC tablet Take 81 mg by mouth daily.     Calcium-Vitamin D-Vitamin K 750-500-40 MG-UNT-MCG TABS Take 1 tablet by mouth daily.     cholecalciferol (VITAMIN D) 1000 units tablet Take 1,000 Units by mouth 2 (two) times daily.     fentaNYL (DURAGESIC - DOSED MCG/HR) 50 MCG/HR Place 12 mcg onto the skin every 3 (three) days.     Ferrous Sulfate Dried 45 MG TBCR Take 45 mg by mouth daily.     lactobacillus acidophilus (BACID) TABS tablet Take 1 tablet by mouth daily.     losartan (COZAAR) 100 MG tablet Take 50 mg by mouth daily.     Magnesium 100 MG TABS Take 100 mg by mouth daily.     omeprazole (PRILOSEC) 20 MG capsule Take 20 mg by mouth daily.     Oxycodone HCl 10 MG TABS Take 10 mg by mouth 3 (three) times daily.     simvastatin (ZOCOR) 40 MG tablet Take 40 mg by mouth daily.     tiZANidine (ZANAFLEX) 4 MG tablet Take 4 mg by mouth 2 (two) times daily.     albuterol (PROVENTIL HFA;VENTOLIN HFA) 108 (90 Base) MCG/ACT inhaler Inhale 2 puffs into the lungs every 6 (six) hours as needed for wheezing or shortness of breath. (Patient not taking:  Reported on 01/18/2022) 1 Inhaler 2   amoxicillin-clavulanate (AUGMENTIN) 875-125 MG tablet Take 1 tablet by mouth 2 (two) times daily. X 10 days (Patient not taking: Reported on 01/18/2022) 20 tablet 0   DULoxetine (CYMBALTA) 60 MG capsule Take 60 mg by mouth at bedtime. (Patient not taking: Reported on 01/18/2022)     No current facility-administered medications for this visit.    Past Medical History:  Diagnosis Date   Chronic back pain    HLD (hyperlipidemia)    Hypertension     Past Surgical History:  Procedure Laterality Date   BACK SURGERY       Social History   Tobacco Use   Smoking status: Never   Smokeless tobacco: Never  Vaping Use   Vaping Use: Never used  Substance Use Topics   Alcohol use: Yes    Alcohol/week: 5.0 standard drinks    Types: 5 Glasses of wine per week   Drug use: No      Family History  Problem Relation Age of Onset   Hypertension Other      Allergies  Allergen Reactions   Latex Rash    After band aid left for couple days After band aid left for couple  days After band aid left for couple days    Nsaids Diarrhea and Nausea And Vomiting   Sulfa Antibiotics Diarrhea and Nausea And Vomiting   Diclofenac Sodium Diarrhea   Naproxen Diarrhea    Any anti-inflammatory causes severe diarrhea Any anti-inflammatory causes severe diarrhea Any anti-inflammatory causes severe diarrhea     REVIEW OF SYSTEMS (Negative unless checked)   Constitutional: [] Weight loss  [] Fever  [] Chills Cardiac: [] Chest pain   [] Chest pressure   [] Palpitations   [] Shortness of breath when laying flat   [] Shortness of breath at rest   [] Shortness of breath with exertion. Vascular:  [x] Pain in legs with walking   [x] Pain in legs at rest   [] Pain in legs when laying flat   [] Claudication   [] Pain in feet when walking  [] Pain in feet at rest  [] Pain in feet when laying flat   [] History of DVT   [] Phlebitis   [x] Swelling in legs   [] Varicose veins   [x] Non-healing  ulcers Pulmonary:   [] Uses home oxygen   [] Productive cough   [] Hemoptysis   [] Wheeze  [] COPD   [] Asthma Neurologic:  [] Dizziness  [] Blackouts   [] Seizures   [] History of stroke   [] History of TIA  [] Aphasia   [] Temporary blindness   [] Dysphagia   [] Weakness or numbness in arms   [] Weakness or numbness in legs Musculoskeletal:  [x] Arthritis   [] Joint swelling   [x] Joint pain   [x] Low back pain Hematologic:  [] Easy bruising  [] Easy bleeding   [] Hypercoagulable state   [] Anemic  [] Hepatitis Gastrointestinal:  [] Blood in stool   [] Vomiting blood  [] Gastroesophageal reflux/heartburn   [] Abdominal pain Genitourinary:  [] Chronic kidney disease   [] Difficult urination  [] Frequent urination  [] Burning with urination   [] Hematuria Skin:  [] Rashes   [x] Ulcers   [x] Wounds Psychological:  [] History of anxiety   []  History of major depression.  Physical Examination  BP (!) 174/83 (BP Location: Left Arm)    Pulse 73    Resp 17    Ht 5' (1.524 m)    Wt 172 lb (78 kg)    BMI 33.59 kg/m  Gen:  WD/WN, NAD Head: [place]/AT, No temporalis wasting. Ear/Nose/Throat: Hearing grossly intact, nares w/o erythema or drainage Eyes: Conjunctiva clear. Sclera non-icteric Neck: Supple.  Trachea midline Pulmonary:  Good air movement, no use of accessory muscles.  Cardiac: RRR, no JVD Vascular:  Vessel Right Left  Radial Palpable Palpable                          PT Palpable Palpable  DP Palpable Palpable   Musculoskeletal: M/S 5/5 throughout.  Wound on the medial aspect of the right foot.  Moderate stasis dermatitis changes present on the right with dry scaling skin throughout the lower leg and mild erythema.  No open wounds or drainage on the calf, but there is a wound on the foot as above. Neurologic: Sensation grossly intact in extremities.  Symmetrical.  Speech is fluent.  Psychiatric: Judgment intact, Mood & affect appropriate for pt's clinical situation. Dermatologic: Dressed wound on the medial aspect of the  right foot.     Labs No results found for this or any previous visit (from the past 2160 hour(s)).  Radiology No results found.  Assessment/Plan HLD (hyperlipidemia) lipid control important in reducing the progression of atherosclerotic disease. Continue statin therapy     HTN (hypertension) blood pressure control important in reducing the progression of atherosclerotic disease. On appropriate oral  medications.  Lymphedema Her right leg is a little more erythematous and the skin is quite dry.  The swelling is under reasonable control, but her recent sprained ankle and chronic ulceration of the right foot have worsened her symptoms.  She is going to use more moisturizers.  She is going to continue compression.  I will see her back on a short follow-up of 6 months or sooner if necessary.    Leotis Pain, MD  01/18/2022 11:03 AM    This note was created with Dragon medical transcription system.  Any errors from dictation are purely unintentional

## 2022-01-18 NOTE — Assessment & Plan Note (Signed)
Her right leg is a little more erythematous and the skin is quite dry.  The swelling is under reasonable control, but her recent sprained ankle and chronic ulceration of the right foot have worsened her symptoms.  She is going to use more moisturizers.  She is going to continue compression.  I will see her back on a short follow-up of 6 months or sooner if necessary.

## 2022-01-23 ENCOUNTER — Other Ambulatory Visit: Payer: Self-pay | Admitting: Family Medicine

## 2022-01-23 DIAGNOSIS — Z1231 Encounter for screening mammogram for malignant neoplasm of breast: Secondary | ICD-10-CM

## 2022-02-28 ENCOUNTER — Other Ambulatory Visit: Payer: Self-pay

## 2022-02-28 ENCOUNTER — Ambulatory Visit
Admission: RE | Admit: 2022-02-28 | Discharge: 2022-02-28 | Disposition: A | Discharge status: Home / Self Care | Payer: Medicare Other | Source: Ambulatory Visit | Attending: Family Medicine | Admitting: Family Medicine

## 2022-02-28 DIAGNOSIS — Z1231 Encounter for screening mammogram for malignant neoplasm of breast: Secondary | ICD-10-CM | POA: Diagnosis present

## 2022-07-12 ENCOUNTER — Ambulatory Visit (INDEPENDENT_AMBULATORY_CARE_PROVIDER_SITE_OTHER): Payer: Medicare Other | Admitting: Vascular Surgery

## 2022-07-19 ENCOUNTER — Encounter (INDEPENDENT_AMBULATORY_CARE_PROVIDER_SITE_OTHER): Payer: Self-pay | Admitting: Vascular Surgery

## 2022-07-19 ENCOUNTER — Ambulatory Visit (INDEPENDENT_AMBULATORY_CARE_PROVIDER_SITE_OTHER): Payer: Medicare Other | Admitting: Vascular Surgery

## 2022-07-19 VITALS — BP 113/64 | HR 73 | Resp 17 | Ht 60.0 in

## 2022-07-19 DIAGNOSIS — E785 Hyperlipidemia, unspecified: Secondary | ICD-10-CM | POA: Diagnosis not present

## 2022-07-19 DIAGNOSIS — I890 Lymphedema, not elsewhere classified: Secondary | ICD-10-CM

## 2022-07-19 DIAGNOSIS — I10 Essential (primary) hypertension: Secondary | ICD-10-CM

## 2022-07-19 DIAGNOSIS — N183 Chronic kidney disease, stage 3 unspecified: Secondary | ICD-10-CM | POA: Diagnosis not present

## 2022-07-19 NOTE — Patient Instructions (Signed)
Lymphedema ? ?Lymphedema is swelling that is caused by the abnormal collection of lymph in the tissues under the skin. Lymph is excess fluid from the tissues in your body that is removed through the lymphatic system. This system is part of your body's defense system (immune system) and includes lymph nodes and lymph vessels. The lymph vessels collect and carry the excess fluid, fats, proteins, and waste from the tissues of the body to the bloodstream. This system also works to clean and remove bacteria and waste products from the body. ?Lymphedema occurs when the lymphatic system is blocked. When the lymph vessels or lymph nodes are blocked or damaged, lymph does not drain properly. This causes an abnormal buildup of lymph, which leads to swelling in the affected area. This may include the trunk area, or an arm or leg. Lymphedema cannot be cured by medicines, but various methods can be used to help reduce the swelling. ?What are the causes? ?The cause of this condition depends on the type of lymphedema that you have. ?Primary lymphedema is caused by the absence of lymph vessels or having abnormal lymph vessels at birth. ?Secondary lymphedema occurs when lymph vessels are blocked or damaged. Secondary lymphedema is more common. Common causes of lymph vessel blockage include: ?Skin infection, such as cellulitis. ?Infection by parasites (filariasis). ?Injury. ?Radiation therapy. ?Cancer. ?Formation of scar tissue. ?Surgery. ?What are the signs or symptoms? ?Symptoms of this condition include: ?Swelling of the arm or leg. ?A heavy or tight feeling in the arm or leg. ?Swelling of the feet, toes, or fingers. Shoes or rings may fit more tightly than before. ?Redness of the skin over the affected area. ?Limited movement of the affected limb. ?Sensitivity to touch or discomfort in the affected limb. ?How is this diagnosed? ?This condition may be diagnosed based on: ?Your symptoms and medical history. ?A physical  exam. ?Bioimpedance spectroscopy. In this test, painless electrical currents are used to measure fluid levels in your body. ?Imaging tests, such as: ?MRI. ?CT scan. ?Duplex ultrasound. This test uses sound waves to produce images of the vessels and the blood flow on a screen. ?Lymphoscintigraphy. In this test, a low dose of a radioactive substance is injected to trace the flow of lymph through your lymph vessels. ?Lymphangiography. In this test, a contrast dye is injected into the lymph vessel to help show blockages. ?How is this treated? ? ?If an underlying condition is causing the lymphedema, that condition will be treated. For example, antibiotic medicines may be used to treat an infection. ?Treatment for this condition will depend on the cause of your lymphedema. Treatment may include: ?Complete decongestive therapy (CDT). This is done by a certified lymphedema therapist to reduce fluid congestion. This therapy includes: ?Skin care. ?Compression wrapping of the affected area. ?Manual lymph drainage. This is a special massage technique that promotes lymph drainage out of a limb. ?Specific exercises. Certain exercises can help fluid move out of the affected limb. ?Compression. Various methods may be used to apply pressure to the affected limb to reduce the swelling. They include: ?Wearing compression stockings or sleeves on the affected limb. ?Wrapping the affected limb with special bandages. ?Surgery. This is usually done for severe cases only. For example, surgery may be done if you have trouble moving the limb or if the swelling does not get better with other treatments. ?Follow these instructions at home: ?Self-care ?The affected area is more likely to become injured or infected. Take these steps to help prevent infection: ?Keep the affected   area clean and dry. ?Use approved creams or lotions to keep the skin moisturized. ?Protect your skin from cuts: ?Use gloves while cooking or gardening. ?Do not walk  barefoot. ?If you shave the affected area, use an electric razor. ?Do not wear tight clothes, shoes, or jewelry. ?Eat a healthy diet that includes a lot of fruits and vegetables. ?Activity ?Do exercises as told by your health care provider. ?Do not sit with your legs crossed. ?When possible, keep the affected limb raised (elevated) above the level of your heart. ?Avoid carrying things with an arm that is affected by lymphedema. ?General instructions ?Wear compression stockings or sleeves as told by your health care provider. ?Note any changes in size of the affected limb. You may be instructed to take regular measurements and keep track of them. ?Take over-the-counter and prescription medicines only as told by your health care provider. ?If you were prescribed an antibiotic medicine, take or apply it as told by your health care provider. Do not stop using the antibiotic even if you start to feel better or if your condition improves. ?Do not use heating pads or ice packs on the affected area. ?Avoid having blood draws, IV insertions, or blood pressure checks on the affected limb. ?Keep all follow-up visits. This is important. ?Contact a health care provider if you: ?Continue to have swelling in your limb. ?Have fluid leaking from the skin of your swollen limb. ?Have a cut that does not heal. ?Have redness or pain in the affected area. ?Develop purplish spots, rash, blisters, or sores (lesions) on your affected limb. ?Get help right away if you: ?Have new swelling in your limb that starts suddenly. ?Have shortness of breath or chest pain. ?Have a fever or chills. ?These symptoms may represent a serious problem that is an emergency. Do not wait to see if the symptoms will go away. Get medical help right away. Call your local emergency services (911 in the [place] Do not drive yourself to the hospital. ?Summary ?Lymphedema is swelling that is caused by the abnormal collection of lymph in the tissues under the  skin. ?Lymph is fluid from the tissues in your body that is removed through the lymphatic system. This system collects and carries excess fluid, fats, proteins, and wastes from the tissues of the body to the bloodstream. ?Lymphedema causes swelling, pain, and redness in the affected area. This may include the trunk area, or an arm or leg. ?Treatment for this condition may depend on the cause of your lymphedema. Treatment may include treating the underlying cause, complete decongestive therapy (CDT), compression methods, or surgery. ?This information is not intended to replace advice given to you by your health care provider. Make sure you discuss any questions you have with your health care provider. ?Document Revised: 10/11/2020 Document Reviewed: 10/11/2020 ?Elsevier Patient Education ? 2023 [employer] ? ?

## 2022-07-19 NOTE — Assessment & Plan Note (Addendum)
blood pressure control important in reducing the progression of atherosclerotic disease. On appropriate oral medications.  We discussed amlodipine in some detail.  The fact that she has had a marked improvement in her blood pressure with amlodipine likely outweighs the increased swelling with amlodipine.  For now, she should continue this.

## 2022-07-19 NOTE — Assessment & Plan Note (Signed)
lipid control important in reducing the progression of atherosclerotic disease. Continue statin therapy  

## 2022-07-19 NOTE — Progress Notes (Signed)
MRN : [id number]  Madison Benson is a 81 y.o. (1941/08/07) female who presents with chief complaint of No chief complaint on file. Marland Kitchen  History of Present Illness: Patient returns today in follow up of lymphedema and leg swelling.  She has had a significant deterioration in her symptoms since her last visit 6 months ago.  She has a multitude of reasons why, and we discussed these in detail today.  Her blood pressure became significantly difficult to control few months ago.  She had markedly elevated blood pressure and had some swelling with this.  She was started on amlodipine and she is well aware that a common side effect of amlodipine is worsening leg swelling.  She also had a significant right ankle sprain with resultant swelling and limited mobility which worsened her swelling as well.  She actually had weeping in the tissues on both legs for several days.  This has improved.  She had also slowed down on wearing her compression socks because her swelling was minimal at the first of the year.  She has resumed wearing these again daily.  The pain also became very significant.  That is better than it was a few weeks ago but not gone.  She also reports the swelling to be slightly better than it was a few weeks ago  Current Outpatient Medications  Medication Sig Dispense Refill   amLODipine (NORVASC) 2.5 MG tablet Take by mouth.     aspirin 81 MG EC tablet Take 81 mg by mouth daily.     Calcium-Vitamin D-Vitamin K 750-500-40 MG-UNT-MCG TABS Take 1 tablet by mouth daily.     cholecalciferol (VITAMIN D) 1000 units tablet Take 1,000 Units by mouth 2 (two) times daily.     DULoxetine (CYMBALTA) 60 MG capsule Take 60 mg by mouth at bedtime.     fentaNYL (DURAGESIC - DOSED MCG/HR) 50 MCG/HR Place 12 mcg onto the skin every 3 (three) days.     Ferrous Sulfate Dried 45 MG TBCR Take 45 mg by mouth daily.     glucose blood test strip 1 each by Other route as needed for other. Use as instructed      losartan (COZAAR) 100 MG tablet Take 50 mg by mouth daily.     Magnesium 100 MG TABS Take 100 mg by mouth daily.     Multiple Vitamins-Minerals (PRESERVISION AREDS 2 PO) Take by mouth.     omeprazole (PRILOSEC) 20 MG capsule Take 20 mg by mouth daily.     Oxycodone HCl 10 MG TABS Take 10 mg by mouth 3 (three) times daily.     simvastatin (ZOCOR) 40 MG tablet Take 40 mg by mouth daily.     solifenacin (VESICARE) 5 MG tablet Take 5 mg by mouth daily.     tiZANidine (ZANAFLEX) 4 MG tablet Take 4 mg by mouth 2 (two) times daily.     lactobacillus acidophilus (BACID) TABS tablet Take 1 tablet by mouth daily. (Patient not taking: Reported on 07/19/2022)     No current facility-administered medications for this visit.    Past Medical History:  Diagnosis Date   Chronic back pain    HLD (hyperlipidemia)    Hypertension     Past Surgical History:  Procedure Laterality Date   BACK SURGERY       Social History   Tobacco Use   Smoking status: Never   Smokeless tobacco: Never  Vaping Use   Vaping Use: Never used  Substance Use Topics  Alcohol use: Yes    Alcohol/week: 5.0 standard drinks of alcohol    Types: 5 Glasses of wine per week   Drug use: No      Family History  Problem Relation Age of Onset   Hypertension Other   No bleeding or clotting disorders, no aneurysms  Allergies  Allergen Reactions   Latex Rash    After band aid left for couple days After band aid left for couple days After band aid left for couple days    Nsaids Diarrhea and Nausea And Vomiting   Sulfa Antibiotics Diarrhea and Nausea And Vomiting   Diclofenac Sodium Diarrhea   Naproxen Diarrhea    Any anti-inflammatory causes severe diarrhea Any anti-inflammatory causes severe diarrhea Any anti-inflammatory causes severe diarrhea      REVIEW OF SYSTEMS (Negative unless checked)  Constitutional: [] Weight loss  [] Fever  [] Chills Cardiac: [] Chest pain   [] Chest pressure   [] Palpitations    [] Shortness of breath when laying flat   [] Shortness of breath at rest   [] Shortness of breath with exertion. Vascular:  [] Pain in legs with walking   [] Pain in legs at rest   [] Pain in legs when laying flat   [] Claudication   [] Pain in feet when walking  [] Pain in feet at rest  [] Pain in feet when laying flat   [] History of DVT   [] Phlebitis   [x] Swelling in legs   [] Varicose veins   [] Non-healing ulcers Pulmonary:   [] Uses home oxygen   [] Productive cough   [] Hemoptysis   [] Wheeze  [] COPD   [] Asthma Neurologic:  [] Dizziness  [] Blackouts   [] Seizures   [] History of stroke   [] History of TIA  [] Aphasia   [] Temporary blindness   [] Dysphagia   [] Weakness or numbness in arms   [] Weakness or numbness in legs Musculoskeletal:  [x] Arthritis   [] Joint swelling   [x] Joint pain   [x] Low back pain Hematologic:  [] Easy bruising  [] Easy bleeding   [] Hypercoagulable state   [] Anemic   Gastrointestinal:  [] Blood in stool   [] Vomiting blood  [] Gastroesophageal reflux/heartburn   [] Abdominal pain Genitourinary:  [] Chronic kidney disease   [] Difficult urination  [] Frequent urination  [] Burning with urination   [] Hematuria Skin:  [] Rashes   [] Ulcers   [] Wounds Psychological:  [] History of anxiety   []  History of major depression.  Physical Examination  BP 113/64 (BP Location: Left Arm)   Pulse 73   Resp 17   Ht 5' (1.524 m)   BMI 33.59 kg/m  Gen:  WD/WN, NAD Head: [place]/AT, No temporalis wasting. Ear/Nose/Throat: Hearing grossly intact, nares w/o erythema or drainage Eyes: Conjunctiva clear. Sclera non-icteric Neck: Supple.  Trachea midline Pulmonary:  Good air movement, no use of accessory muscles.  Cardiac: RRR, no JVD Vascular:  Vessel Right Left  Radial Palpable Palpable                       Musculoskeletal: M/S 5/5 throughout. Marked arthritic changes, walks with a walker 1-2+ BLE edema. Neurologic: Sensation grossly intact in extremities.  Symmetrical.  Speech is fluent.  Psychiatric: Judgment  intact, Mood & affect appropriate for pt's clinical situation. Dermatologic: No rashes or ulcers noted.  No cellulitis or open wounds.      Labs No results found for this or any previous visit (from the past 2160 hour(s)).  Radiology No results found.  Assessment/Plan  Lymphedema The patient has had significant deterioration of her lymphedema symptoms for variety of reasons.  She has  been wearing her compression socks daily for several weeks and still has marked swelling.  Her skin weeping has improved so we do not need to wrap her in Unna boots.  She has at least stage II lymphedema at this point and a lymphedema pump would be an excellent adjuvant therapy to improve her symptoms.  I have also recommended she continue to wear her compression socks and elevate her legs as tolerated which she is already doing.  Her activity is improving after her ankle injury.  We will shorten her follow-up and see her in 3 to 4 months.  HLD (hyperlipidemia) lipid control important in reducing the progression of atherosclerotic disease. Continue statin therapy   CKD (chronic kidney disease) stage 3, GFR 30-59 ml/min (HCC) Can certainly worsen LE edema.  Now on Norvasc as well for her hypertension which can worsen lower extremity edema as well, but this has markedly improved her blood pressure.  HTN (hypertension) blood pressure control important in reducing the progression of atherosclerotic disease. On appropriate oral medications.  We discussed amlodipine in some detail.  The fact that she has had a marked improvement in her blood pressure with amlodipine likely outweighs the increased swelling with amlodipine.  For now, she should continue this.    Festus Barren, MD  07/19/2022 11:36 AM    This note was created with Dragon medical transcription system.  Any errors from dictation are purely unintentional

## 2022-07-19 NOTE — Assessment & Plan Note (Signed)
Can certainly worsen LE edema.  Now on Norvasc as well for her hypertension which can worsen lower extremity edema as well, but this has markedly improved her blood pressure.

## 2022-07-19 NOTE — Assessment & Plan Note (Signed)
The patient has had significant deterioration of her lymphedema symptoms for variety of reasons.  She has been wearing her compression socks daily for several weeks and still has marked swelling.  Her skin weeping has improved so we do not need to wrap her in Unna boots.  She has at least stage II lymphedema at this point and a lymphedema pump would be an excellent adjuvant therapy to improve her symptoms.  I have also recommended she continue to wear her compression socks and elevate her legs as tolerated which she is already doing.  Her activity is improving after her ankle injury.  We will shorten her follow-up and see her in 3 to 4 months.

## 2022-10-28 ENCOUNTER — Encounter (INDEPENDENT_AMBULATORY_CARE_PROVIDER_SITE_OTHER): Payer: Self-pay

## 2022-10-29 ENCOUNTER — Ambulatory Visit (INDEPENDENT_AMBULATORY_CARE_PROVIDER_SITE_OTHER): Payer: Medicare Other | Admitting: Vascular Surgery

## 2022-10-29 ENCOUNTER — Encounter (INDEPENDENT_AMBULATORY_CARE_PROVIDER_SITE_OTHER): Payer: Self-pay | Admitting: Vascular Surgery

## 2022-10-29 VITALS — BP 154/75 | HR 73 | Resp 18 | Ht 60.0 in | Wt 174.2 lb

## 2022-10-29 DIAGNOSIS — I890 Lymphedema, not elsewhere classified: Secondary | ICD-10-CM | POA: Diagnosis not present

## 2022-10-29 DIAGNOSIS — I10 Essential (primary) hypertension: Secondary | ICD-10-CM

## 2022-10-29 DIAGNOSIS — E785 Hyperlipidemia, unspecified: Secondary | ICD-10-CM | POA: Diagnosis not present

## 2022-10-29 DIAGNOSIS — N183 Chronic kidney disease, stage 3 unspecified: Secondary | ICD-10-CM

## 2022-10-29 NOTE — Progress Notes (Signed)
MRN : [id number]  Madison Benson is a 81 y.o. (February 09, 1941) female who presents with chief complaint of No chief complaint on file. Marland Kitchen  History of Present Illness: Patient returns today in follow up of her leg swelling.  This is markedly improved over her visit from a few months ago.  She has been diligently elevating her legs and using her compression socks and that seems to have helped.  No ulceration or infection.  No fevers or chills.  Current Outpatient Medications  Medication Sig Dispense Refill   amLODipine (NORVASC) 2.5 MG tablet Take by mouth.     aspirin 81 MG EC tablet Take 81 mg by mouth daily.     Calcium-Vitamin D-Vitamin K 741-287-86 MG-UNT-MCG TABS Take 1 tablet by mouth daily.     cholecalciferol (VITAMIN D) 1000 units tablet Take 1,000 Units by mouth 2 (two) times daily.     DULoxetine (CYMBALTA) 60 MG capsule Take 60 mg by mouth at bedtime.     fentaNYL (DURAGESIC - DOSED MCG/HR) 50 MCG/HR Place 12 mcg onto the skin every 3 (three) days.     Ferrous Sulfate Dried 45 MG TBCR Take 45 mg by mouth daily.     glucose blood test strip 1 each by Other route as needed for other. Use as instructed     lactobacillus acidophilus (BACID) TABS tablet Take 1 tablet by mouth daily.     losartan (COZAAR) 100 MG tablet Take 50 mg by mouth daily.     Magnesium 100 MG TABS Take 100 mg by mouth daily.     Multiple Vitamins-Minerals (PRESERVISION AREDS 2 PO) Take by mouth.     omeprazole (PRILOSEC) 20 MG capsule Take 20 mg by mouth daily.     Oxycodone HCl 10 MG TABS Take 10 mg by mouth 3 (three) times daily.     polyethylene glycol (MIRALAX / GLYCOLAX) 17 g packet Take 17 g by mouth daily.     simvastatin (ZOCOR) 40 MG tablet Take 40 mg by mouth daily.     solifenacin (VESICARE) 5 MG tablet Take 5 mg by mouth daily.     tiZANidine (ZANAFLEX) 4 MG tablet Take 4 mg by mouth 2 (two) times daily.     No current facility-administered medications for this visit.    Past Medical  History:  Diagnosis Date   Chronic back pain    HLD (hyperlipidemia)    Hypertension     Past Surgical History:  Procedure Laterality Date   BACK SURGERY       Social History   Tobacco Use   Smoking status: Never   Smokeless tobacco: Never  Vaping Use   Vaping Use: Never used  Substance Use Topics   Alcohol use: Yes    Alcohol/week: 5.0 standard drinks of alcohol    Types: 5 Glasses of wine per week   Drug use: No      Family History  Problem Relation Age of Onset   Hypertension Other      Allergies  Allergen Reactions   Latex Rash    After band aid left for couple days After band aid left for couple days After band aid left for couple days    Nsaids Diarrhea and Nausea And Vomiting   Sulfa Antibiotics Diarrhea and Nausea And Vomiting   Ciprofloxacin Itching   Diclofenac Sodium Diarrhea   Naproxen Diarrhea    Any anti-inflammatory causes severe diarrhea Any anti-inflammatory causes severe diarrhea Any anti-inflammatory causes severe diarrhea  REVIEW OF SYSTEMS (Negative unless checked)   Constitutional: [] Weight loss  [] Fever  [] Chills Cardiac: [] Chest pain   [] Chest pressure   [] Palpitations   [] Shortness of breath when laying flat   [] Shortness of breath at rest   [] Shortness of breath with exertion. Vascular:  [] Pain in legs with walking   [] Pain in legs at rest   [] Pain in legs when laying flat   [] Claudication   [] Pain in feet when walking  [] Pain in feet at rest  [] Pain in feet when laying flat   [] History of DVT   [] Phlebitis   [x] Swelling in legs   [] Varicose veins   [] Non-healing ulcers Pulmonary:   [] Uses home oxygen   [] Productive cough   [] Hemoptysis   [] Wheeze  [] COPD   [] Asthma Neurologic:  [] Dizziness  [] Blackouts   [] Seizures   [] History of stroke   [] History of TIA  [] Aphasia   [] Temporary blindness   [] Dysphagia   [] Weakness or numbness in arms   [] Weakness or numbness in legs Musculoskeletal:  [x] Arthritis   [] Joint swelling   [x] Joint  pain   [x] Low back pain Hematologic:  [] Easy bruising  [] Easy bleeding   [] Hypercoagulable state   [] Anemic   Gastrointestinal:  [] Blood in stool   [] Vomiting blood  [] Gastroesophageal reflux/heartburn   [] Abdominal pain Genitourinary:  [] Chronic kidney disease   [] Difficult urination  [] Frequent urination  [] Burning with urination   [] Hematuria Skin:  [] Rashes   [] Ulcers   [] Wounds Psychological:  [] History of anxiety   []  History of major depression.  Physical Examination  BP (!) 154/75 (BP Location: Left Arm)   Pulse 73   Resp 18   Ht 5' (1.524 m)   Wt 174 lb 3.2 oz (79 kg)   BMI 34.02 kg/m  Gen:  WD/WN, NAD Head: [place]/AT, No temporalis wasting. Ear/Nose/Throat: Hearing grossly intact, nares w/o erythema or drainage Eyes: Conjunctiva clear. Sclera non-icteric Neck: Supple.  Trachea midline Pulmonary:  Good air movement, no use of accessory muscles.  Cardiac: RRR, no JVD Vascular:  Vessel Right Left  Radial Palpable Palpable           Musculoskeletal: M/S 5/5 throughout.  No deformity or atrophy.  Trace bilateral lower extremity edema.  Marked arthritic changes are present Neurologic: Sensation grossly intact in extremities.  Symmetrical.  Speech is fluent.  Psychiatric: Judgment intact, Mood & affect appropriate for pt's clinical situation. Dermatologic: No rashes or ulcers noted.  No cellulitis or open wounds.      Labs No results found for this or any previous visit (from the past 2160 hour(s)).  Radiology No results found.  Assessment/Plan HLD (hyperlipidemia) lipid control important in reducing the progression of atherosclerotic disease. Continue statin therapy     CKD (chronic kidney disease) stage 3, GFR 30-59 ml/min (HCC) Can certainly worsen LE edema.  Now on Norvasc as well for her hypertension which can worsen lower extremity edema as well, but this has markedly improved her blood pressure.   HTN (hypertension) blood pressure control important in  reducing the progression of atherosclerotic disease. On appropriate oral medications.    Lymphedema Her swelling is significantly better than it was at her last visit.  Continue compression socks, elevation, and we will go back to once a year follow-up at this point unless she has worsening symptoms and if she does she will contact our office.    , MD  10/29/2022 2:55 PM    This note was created with Dragon medical transcription system.  Any errors from dictation are purely unintentional

## 2022-10-29 NOTE — Assessment & Plan Note (Signed)
Her swelling is significantly better than it was at her last visit.  Continue compression socks, elevation, and we will go back to once a year follow-up at this point unless she has worsening symptoms and if she does she will contact our office.

## 2023-02-26 ENCOUNTER — Emergency Department: Payer: Medicare Other

## 2023-02-26 ENCOUNTER — Inpatient Hospital Stay
Admit: 2023-02-26 | Discharge: 2023-02-26 | Disposition: A | Discharge status: Home / Self Care | Payer: Medicare Other | Attending: Family Medicine | Admitting: Family Medicine

## 2023-02-26 ENCOUNTER — Encounter: Payer: Self-pay | Admitting: Radiology

## 2023-02-26 ENCOUNTER — Observation Stay
Admission: EM | Admit: 2023-02-26 | Discharge: 2023-02-27 | Disposition: A | Discharge status: Home Health | Payer: Medicare Other | Attending: Internal Medicine | Admitting: Internal Medicine

## 2023-02-26 ENCOUNTER — Other Ambulatory Visit: Payer: Self-pay

## 2023-02-26 DIAGNOSIS — Z91040 Latex allergy status: Secondary | ICD-10-CM | POA: Insufficient documentation

## 2023-02-26 DIAGNOSIS — M6281 Muscle weakness (generalized): Secondary | ICD-10-CM | POA: Diagnosis not present

## 2023-02-26 DIAGNOSIS — Z79899 Other long term (current) drug therapy: Secondary | ICD-10-CM | POA: Insufficient documentation

## 2023-02-26 DIAGNOSIS — R0602 Shortness of breath: Secondary | ICD-10-CM | POA: Diagnosis present

## 2023-02-26 DIAGNOSIS — K219 Gastro-esophageal reflux disease without esophagitis: Secondary | ICD-10-CM | POA: Diagnosis not present

## 2023-02-26 DIAGNOSIS — J9601 Acute respiratory failure with hypoxia: Secondary | ICD-10-CM | POA: Diagnosis not present

## 2023-02-26 DIAGNOSIS — R7989 Other specified abnormal findings of blood chemistry: Secondary | ICD-10-CM | POA: Diagnosis not present

## 2023-02-26 DIAGNOSIS — Z720 Tobacco use: Secondary | ICD-10-CM | POA: Diagnosis not present

## 2023-02-26 DIAGNOSIS — R000 Tachycardia, unspecified: Secondary | ICD-10-CM | POA: Diagnosis not present

## 2023-02-26 DIAGNOSIS — Z7982 Long term (current) use of aspirin: Secondary | ICD-10-CM | POA: Insufficient documentation

## 2023-02-26 DIAGNOSIS — F17210 Nicotine dependence, cigarettes, uncomplicated: Secondary | ICD-10-CM | POA: Insufficient documentation

## 2023-02-26 DIAGNOSIS — Z9181 History of falling: Secondary | ICD-10-CM | POA: Insufficient documentation

## 2023-02-26 DIAGNOSIS — Z1152 Encounter for screening for COVID-19: Secondary | ICD-10-CM | POA: Insufficient documentation

## 2023-02-26 DIAGNOSIS — E785 Hyperlipidemia, unspecified: Secondary | ICD-10-CM | POA: Diagnosis not present

## 2023-02-26 DIAGNOSIS — R2681 Unsteadiness on feet: Secondary | ICD-10-CM | POA: Insufficient documentation

## 2023-02-26 DIAGNOSIS — I10 Essential (primary) hypertension: Secondary | ICD-10-CM | POA: Diagnosis present

## 2023-02-26 LAB — BASIC METABOLIC PANEL
Anion gap: 10 (ref 5–15)
BUN: 32 mg/dL — ABNORMAL HIGH (ref 8–23)
CO2: 24 mmol/L (ref 22–32)
Calcium: 8.9 mg/dL (ref 8.9–10.3)
Chloride: 105 mmol/L (ref 98–111)
Creatinine, Ser: 1.18 mg/dL — ABNORMAL HIGH (ref 0.44–1.00)
GFR, Estimated: 46 mL/min — ABNORMAL LOW (ref >60)
Glucose, Bld: 124 mg/dL — ABNORMAL HIGH (ref 70–99)
Potassium: 4.1 mmol/L (ref 3.5–5.1)
Sodium: 139 mmol/L (ref 135–145)

## 2023-02-26 LAB — URINALYSIS, ROUTINE W REFLEX MICROSCOPIC
Bilirubin Urine: NEGATIVE
Glucose, UA: NEGATIVE mg/dL
Hgb urine dipstick: NEGATIVE
Ketones, ur: NEGATIVE mg/dL
Leukocytes,Ua: NEGATIVE
Nitrite: NEGATIVE
Protein, ur: NEGATIVE mg/dL
Specific Gravity, Urine: 1.026 (ref 1.005–1.030)
pH: 6 (ref 5.0–8.0)

## 2023-02-26 LAB — ECHOCARDIOGRAM COMPLETE
AR max vel: 1.83 cm2
AV Area VTI: 2.26 cm2
AV Area mean vel: 2.21 cm2
AV Mean grad: 7 mmHg
AV Peak grad: 15.7 mmHg
Ao pk vel: 1.98 m/s
Area-P 1/2: 2.19 cm2
MV VTI: 3.28 cm2
S' Lateral: 2.8 cm
Weight: 2720 oz

## 2023-02-26 LAB — CBC
HCT: 38.8 % (ref 36.0–46.0)
Hemoglobin: 11.8 g/dL — ABNORMAL LOW (ref 12.0–15.0)
MCH: 30 pg (ref 26.0–34.0)
MCHC: 30.4 g/dL (ref 30.0–36.0)
MCV: 98.7 fL (ref 80.0–100.0)
Platelets: 191 10*3/uL (ref 150–400)
RBC: 3.93 MIL/uL (ref 3.87–5.11)
RDW: 13.2 % (ref 11.5–15.5)
WBC: 11.5 10*3/uL — ABNORMAL HIGH (ref 4.0–10.5)
nRBC: 0 % (ref 0.0–0.2)

## 2023-02-26 LAB — COMPREHENSIVE METABOLIC PANEL
ALT: 24 U/L (ref 0–44)
AST: 27 U/L (ref 15–41)
Albumin: 4 g/dL (ref 3.5–5.0)
Alkaline Phosphatase: 122 U/L (ref 38–126)
Anion gap: 11 (ref 5–15)
BUN: 29 mg/dL — ABNORMAL HIGH (ref 8–23)
CO2: 23 mmol/L (ref 22–32)
Calcium: 8.9 mg/dL (ref 8.9–10.3)
Chloride: 106 mmol/L (ref 98–111)
Creatinine, Ser: 1.16 mg/dL — ABNORMAL HIGH (ref 0.44–1.00)
GFR, Estimated: 47 mL/min — ABNORMAL LOW (ref >60)
Glucose, Bld: 120 mg/dL — ABNORMAL HIGH (ref 70–99)
Potassium: 3.8 mmol/L (ref 3.5–5.1)
Sodium: 140 mmol/L (ref 135–145)
Total Bilirubin: 0.5 mg/dL (ref 0.3–1.2)
Total Protein: 6.4 g/dL — ABNORMAL LOW (ref 6.5–8.1)

## 2023-02-26 LAB — CBC WITH DIFFERENTIAL/PLATELET
Abs Immature Granulocytes: 0.03 10*3/uL (ref 0.00–0.07)
Basophils Absolute: 0 10*3/uL (ref 0.0–0.1)
Basophils Relative: 1 %
Eosinophils Absolute: 0.3 10*3/uL (ref 0.0–0.5)
Eosinophils Relative: 4 %
HCT: 41.3 % (ref 36.0–46.0)
Hemoglobin: 12.9 g/dL (ref 12.0–15.0)
Immature Granulocytes: 0 %
Lymphocytes Relative: 13 %
Lymphs Abs: 1.1 10*3/uL (ref 0.7–4.0)
MCH: 29.8 pg (ref 26.0–34.0)
MCHC: 31.2 g/dL (ref 30.0–36.0)
MCV: 95.4 fL (ref 80.0–100.0)
Monocytes Absolute: 0.4 10*3/uL (ref 0.1–1.0)
Monocytes Relative: 5 %
Neutro Abs: 6.5 10*3/uL (ref 1.7–7.7)
Neutrophils Relative %: 77 %
Platelets: 237 10*3/uL (ref 150–400)
RBC: 4.33 MIL/uL (ref 3.87–5.11)
RDW: 13.3 % (ref 11.5–15.5)
WBC: 8.4 10*3/uL (ref 4.0–10.5)
nRBC: 0 % (ref 0.0–0.2)

## 2023-02-26 LAB — BLOOD GAS, VENOUS
Acid-Base Excess: 1.3 mmol/L (ref 0.0–2.0)
Bicarbonate: 27.2 mmol/L (ref 20.0–28.0)
O2 Saturation: 61.9 %
Patient temperature: 37
pCO2, Ven: 47 mmHg (ref 44–60)
pH, Ven: 7.37 (ref 7.25–7.43)
pO2, Ven: 41 mmHg (ref 32–45)

## 2023-02-26 LAB — LACTIC ACID, PLASMA
Lactic Acid, Venous: 1 mmol/L (ref 0.5–1.9)
Lactic Acid, Venous: 0.8 mmol/L (ref 0.5–1.9)

## 2023-02-26 LAB — TROPONIN I (HIGH SENSITIVITY)
Troponin I (High Sensitivity): 10 ng/L (ref <18)
Troponin I (High Sensitivity): 15 ng/L (ref <18)

## 2023-02-26 LAB — LIPASE, BLOOD: Lipase: 37 U/L (ref 11–51)

## 2023-02-26 LAB — RESP PANEL BY RT-PCR (RSV, FLU A&B, COVID)  RVPGX2
Influenza A by PCR: NEGATIVE
Influenza B by PCR: NEGATIVE
Resp Syncytial Virus by PCR: NEGATIVE
SARS Coronavirus 2 by RT PCR: NEGATIVE

## 2023-02-26 LAB — BRAIN NATRIURETIC PEPTIDE: B Natriuretic Peptide: 131.1 pg/mL — ABNORMAL HIGH (ref 0.0–100.0)

## 2023-02-26 MED ORDER — FENTANYL 12 MCG/HR TD PT72
1.0000 | MEDICATED_PATCH | TRANSDERMAL | Status: DC
  Administered 2023-02-26: 1 via TRANSDERMAL
  Filled 2023-02-26: qty 1

## 2023-02-26 MED ORDER — ALPRAZOLAM 0.25 MG PO TABS: 0.2500 mg | ORAL_TABLET | Freq: Two times a day (BID) | ORAL | Status: DC | PRN

## 2023-02-26 MED ORDER — METHYLPREDNISOLONE SODIUM SUCC 40 MG IJ SOLR
40.0000 mg | Freq: Two times a day (BID) | INTRAMUSCULAR | Status: DC
  Administered 2023-02-26 – 2023-02-27 (×2): 40 mg via INTRAVENOUS
  Filled 2023-02-26 (×2): qty 1

## 2023-02-26 MED ORDER — MAGNESIUM HYDROXIDE 400 MG/5ML PO SUSP: 30.0000 mL | Freq: Every day | ORAL | Status: DC | PRN

## 2023-02-26 MED ORDER — IPRATROPIUM-ALBUTEROL 0.5-2.5 (3) MG/3ML IN SOLN
3.0000 mL | Freq: Four times a day (QID) | RESPIRATORY_TRACT | Status: DC
  Administered 2023-02-26 – 2023-02-27 (×4): 3 mL via RESPIRATORY_TRACT
  Filled 2023-02-26: qty 3
  Filled 2023-02-26: qty 6
  Filled 2023-02-26 (×2): qty 3

## 2023-02-26 MED ORDER — PROSIGHT PO TABS
1.0000 | ORAL_TABLET | Freq: Every day | ORAL | Status: DC
  Administered 2023-02-26 – 2023-02-27 (×2): 1 via ORAL
  Filled 2023-02-26 (×2): qty 1

## 2023-02-26 MED ORDER — VITAMIN D 25 MCG (1000 UNIT) PO TABS
1000.0000 [IU] | ORAL_TABLET | Freq: Two times a day (BID) | ORAL | Status: DC
  Administered 2023-02-26 – 2023-02-27 (×3): 1000 [IU] via ORAL
  Filled 2023-02-26 (×3): qty 1

## 2023-02-26 MED ORDER — OXYCODONE HCL 5 MG PO TABS
10.0000 mg | ORAL_TABLET | Freq: Three times a day (TID) | ORAL | Status: DC
  Administered 2023-02-26 – 2023-02-27 (×3): 10 mg via ORAL
  Filled 2023-02-26 (×3): qty 2

## 2023-02-26 MED ORDER — SODIUM CHLORIDE 0.9 % IV BOLUS
1000.0000 mL | Freq: Once | INTRAVENOUS | Status: AC
  Administered 2023-02-26: 1000 mL via INTRAVENOUS

## 2023-02-26 MED ORDER — OYSTER SHELL CALCIUM/D3 500-5 MG-MCG PO TABS
1.0000 | ORAL_TABLET | Freq: Every day | ORAL | Status: DC
  Administered 2023-02-26 – 2023-02-27 (×2): 1 via ORAL
  Filled 2023-02-26 (×2): qty 1

## 2023-02-26 MED ORDER — POLYETHYLENE GLYCOL 3350 17 G PO PACK
17.0000 g | PACK | Freq: Every day | ORAL | Status: DC
  Administered 2023-02-26: 17 g via ORAL
  Filled 2023-02-26: qty 1

## 2023-02-26 MED ORDER — TRAZODONE HCL 50 MG PO TABS: 25.0000 mg | ORAL_TABLET | Freq: Every evening | ORAL | Status: DC | PRN

## 2023-02-26 MED ORDER — ASPIRIN 81 MG PO TBEC
81.0000 mg | DELAYED_RELEASE_TABLET | Freq: Every day | ORAL | Status: DC
  Administered 2023-02-26 – 2023-02-27 (×2): 81 mg via ORAL
  Filled 2023-02-26 (×2): qty 1

## 2023-02-26 MED ORDER — FUROSEMIDE 10 MG/ML IJ SOLN: 20.0000 mg | Freq: Two times a day (BID) | INTRAMUSCULAR | Status: DC

## 2023-02-26 MED ORDER — DOXYCYCLINE HYCLATE 100 MG PO TABS
100.0000 mg | ORAL_TABLET | Freq: Two times a day (BID) | ORAL | Status: DC
  Administered 2023-02-26 – 2023-02-27 (×2): 100 mg via ORAL
  Filled 2023-02-26 (×2): qty 1

## 2023-02-26 MED ORDER — ENOXAPARIN SODIUM 40 MG/0.4ML IJ SOSY
0.5000 mg/kg | PREFILLED_SYRINGE | INTRAMUSCULAR | Status: DC
  Administered 2023-02-26 – 2023-02-27 (×2): 37.5 mg via SUBCUTANEOUS
  Filled 2023-02-26 (×2): qty 0.4

## 2023-02-26 MED ORDER — DULOXETINE HCL 60 MG PO CPEP
60.0000 mg | ORAL_CAPSULE | Freq: Every day | ORAL | Status: DC
  Filled 2023-02-26: qty 1

## 2023-02-26 MED ORDER — ONDANSETRON HCL 4 MG PO TABS: 4.0000 mg | ORAL_TABLET | Freq: Four times a day (QID) | ORAL | Status: DC | PRN

## 2023-02-26 MED ORDER — PANTOPRAZOLE SODIUM 40 MG PO TBEC
40.0000 mg | DELAYED_RELEASE_TABLET | Freq: Every day | ORAL | Status: DC
  Administered 2023-02-26 – 2023-02-27 (×2): 40 mg via ORAL
  Filled 2023-02-26 (×2): qty 1

## 2023-02-26 MED ORDER — FUROSEMIDE 10 MG/ML IJ SOLN
40.0000 mg | Freq: Once | INTRAMUSCULAR | Status: AC
  Administered 2023-02-26: 40 mg via INTRAVENOUS
  Filled 2023-02-26: qty 4

## 2023-02-26 MED ORDER — RISAQUAD PO CAPS
1.0000 | ORAL_CAPSULE | Freq: Every day | ORAL | Status: DC
  Administered 2023-02-26 – 2023-02-27 (×2): 1 via ORAL
  Filled 2023-02-26 (×2): qty 1

## 2023-02-26 MED ORDER — TIZANIDINE HCL 2 MG PO TABS
4.0000 mg | ORAL_TABLET | Freq: Two times a day (BID) | ORAL | Status: DC
  Administered 2023-02-26 – 2023-02-27 (×3): 4 mg via ORAL
  Filled 2023-02-26 (×3): qty 2

## 2023-02-26 MED ORDER — SIMVASTATIN 10 MG PO TABS
20.0000 mg | ORAL_TABLET | Freq: Every day | ORAL | Status: DC
  Administered 2023-02-26: 20 mg via ORAL
  Filled 2023-02-26: qty 2

## 2023-02-26 MED ORDER — GUAIFENESIN ER 600 MG PO TB12
600.0000 mg | ORAL_TABLET | Freq: Two times a day (BID) | ORAL | Status: DC
  Administered 2023-02-26 – 2023-02-27 (×3): 600 mg via ORAL
  Filled 2023-02-26 (×3): qty 1

## 2023-02-26 MED ORDER — ACETAMINOPHEN 650 MG RE SUPP: 650.0000 mg | Freq: Four times a day (QID) | RECTAL | Status: DC | PRN

## 2023-02-26 MED ORDER — ALBUTEROL SULFATE (2.5 MG/3ML) 0.083% IN NEBU: 2.5000 mg | INHALATION_SOLUTION | RESPIRATORY_TRACT | Status: DC | PRN

## 2023-02-26 MED ORDER — IOHEXOL 350 MG/ML SOLN
75.0000 mL | Freq: Once | INTRAVENOUS | Status: AC | PRN
  Administered 2023-02-26: 75 mL via INTRAVENOUS

## 2023-02-26 MED ORDER — LOSARTAN POTASSIUM 50 MG PO TABS
50.0000 mg | ORAL_TABLET | Freq: Every day | ORAL | Status: DC
  Administered 2023-02-26 – 2023-02-27 (×2): 50 mg via ORAL
  Filled 2023-02-26 (×2): qty 1

## 2023-02-26 MED ORDER — FENTANYL 50 MCG/HR TD PT72: 1.0000 | MEDICATED_PATCH | TRANSDERMAL | Status: DC

## 2023-02-26 MED ORDER — ACETAMINOPHEN 325 MG PO TABS: 650.0000 mg | ORAL_TABLET | Freq: Four times a day (QID) | ORAL | Status: DC | PRN

## 2023-02-26 MED ORDER — HYDROCOD POLI-CHLORPHE POLI ER 10-8 MG/5ML PO SUER: 5.0000 mL | Freq: Two times a day (BID) | ORAL | Status: DC | PRN

## 2023-02-26 MED ORDER — METHYLPREDNISOLONE SODIUM SUCC 125 MG IJ SOLR
125.0000 mg | Freq: Once | INTRAMUSCULAR | Status: AC
  Administered 2023-02-26: 125 mg via INTRAVENOUS
  Filled 2023-02-26: qty 2

## 2023-02-26 MED ORDER — MAGNESIUM OXIDE -MG SUPPLEMENT 400 (240 MG) MG PO TABS
200.0000 mg | ORAL_TABLET | Freq: Every day | ORAL | Status: DC
  Administered 2023-02-26 – 2023-02-27 (×2): 200 mg via ORAL
  Filled 2023-02-26 (×2): qty 1

## 2023-02-26 MED ORDER — ONDANSETRON HCL 4 MG/2ML IJ SOLN: 4.0000 mg | Freq: Four times a day (QID) | INTRAMUSCULAR | Status: DC | PRN

## 2023-02-26 MED ORDER — FESOTERODINE FUMARATE ER 4 MG PO TB24
4.0000 mg | ORAL_TABLET | Freq: Every day | ORAL | Status: DC
  Administered 2023-02-26 – 2023-02-27 (×2): 4 mg via ORAL
  Filled 2023-02-26 (×2): qty 1

## 2023-02-26 MED ORDER — DULOXETINE HCL 60 MG PO CPEP
60.0000 mg | ORAL_CAPSULE | Freq: Every day | ORAL | Status: DC
  Administered 2023-02-26: 60 mg via ORAL
  Filled 2023-02-26: qty 1

## 2023-02-26 MED ORDER — AMLODIPINE BESYLATE 5 MG PO TABS
2.5000 mg | ORAL_TABLET | Freq: Every day | ORAL | Status: DC
  Filled 2023-02-26: qty 1

## 2023-02-26 MED ORDER — FERROUS SULFATE 325 (65 FE) MG PO TABS
325.0000 mg | ORAL_TABLET | Freq: Every day | ORAL | Status: DC
  Administered 2023-02-26 – 2023-02-27 (×2): 325 mg via ORAL
  Filled 2023-02-26 (×2): qty 1

## 2023-02-26 NOTE — ED Notes (Signed)
ECHO at bedside.

## 2023-02-26 NOTE — ED Notes (Signed)
Talked to MD about patients BP that are low and MD wants to hold all BP meds at this time and keep an eye on BP and see how the echo turns out.

## 2023-02-26 NOTE — Assessment & Plan Note (Signed)
-   Differential diagnosis would include new onset acute CHF likely diastolic, given mildly elevated BNP, interstitial pulmonary fibrosis related to Macrobid, reactive airway disease and COPD exacerbation given her persistent smoking. - She will be admitted to a progressive unit bed. - We will place on the with IV Lasix. - We will obtain a 2D echo. - Will place on steroid therapy with IV Solu-Medrol. - She will be placed on DuoNebs 4 times daily and every 4 hours as needed. - O2 protocol will be followed. -Depending on results and management response will defer cardiology +/- pulmonology consultation to the morning hospitalist.

## 2023-02-26 NOTE — ED Notes (Signed)
Patient on BiPAP at this time.

## 2023-02-26 NOTE — Assessment & Plan Note (Signed)
-   We will continue statin therapy. 

## 2023-02-26 NOTE — Progress Notes (Signed)
OT Cancellation Note  Patient Details Name: Madison Benson MRN: [id number] DOB: 19-May-1941   Cancelled Treatment:    Reason Eval/Treat Not Completed: Patient at procedure or test/ unavailable. OT orders received, chart reviewed. Pt currently getting echo done in room. Will re-attempt as able.  Doneta Public 02/26/2023, 2:36 PM

## 2023-02-26 NOTE — ED Notes (Addendum)
Patient arrived with Left shoulder 23mg/hr Fentanyl patch from home. This RN removed patch and wasted into controlled substance waste container per request of pharmacy and admitting MD. Waste was verified by RKari Baars RN. New patch placed per Pharmacy and MD request.

## 2023-02-26 NOTE — ED Notes (Signed)
Patient x1 assist to restroom, clean catch urine sample obtained. Patient with increased dyspnea when ambulating. Patient returned to bed, patient placed on purewick at this time d/t dyspnea with exertion and lasix administration.

## 2023-02-26 NOTE — Assessment & Plan Note (Signed)
-   We will continue her home pain therapy.

## 2023-02-26 NOTE — ED Triage Notes (Signed)
Patient was ambulating when she became short of breath suddenly. Per EMS patient needed to be titrated up to 6L to maintain O2 saturation. Sats upon EMS arrival 88-89% and when patient stood to ambulate to the ambulance, she had to be put in the tripod position to breathe effectively

## 2023-02-26 NOTE — ED Notes (Addendum)
This RN to bedside to medicate and assess pt. Pt. Verbalizes frustration about not knowing what the plan for her care is, what is happening and what is going to happen. This RN asks if there are any questions that I can answer for her. Pt. Requests to speak to a physician because she spoke with one early this AM, and they did not explain to her why she is sitting and waiting. This RN explains to pt. That she has had several test done today including ECHO, blood work, cultures etc, and that she is being treated because her lungs are not working as they should. Pt. States she wants a doctor to explain to her. Pt's tone increasing in volume and speed. Pt. Is tearful. This RN states that she will message Dr. Alfredia Ferguson. Dr. Alfredia Ferguson messaged about pt's distress and questions.  Dr. Alfredia Ferguson responds that he has spoken to this pt. In great length this AM, and he is unable to come to bedside at this moment. This RN notifies MD that she will continue to verbally deescalate pt, and will offer her oxycodone to help ease her discomfort, and help her to relax.  This RN repeats explanation to pt. That she has been admitted to hospital to treat her lungs and allow them time to rest and recover while they continue to test for causes. This RN conveys that Dr. Alfredia Ferguson is not available at this time, but I will do my best to answer any questions pt. Has and continues to have.  Pt. Requests to get up to bedside commode as she is having a hard time urinating with the purewick. This RN obtains a bedside commode and gets pt. To it x1 assist. Pt. Urinates approx 957m, and states that she feels much relieved.  Pt. Back to bed. Sits at bedside and eats dinner with spouse at bedside.

## 2023-02-26 NOTE — Progress Notes (Signed)
*  PRELIMINARY RESULTS* Echocardiogram 2D Echocardiogram has been performed.  Madison Benson 02/26/2023, 3:17 PM

## 2023-02-26 NOTE — ED Notes (Signed)
Dr. Sheikh at bedside.

## 2023-02-26 NOTE — Assessment & Plan Note (Signed)
She was counseled for smoking cessation and will receive further counseling here. 

## 2023-02-26 NOTE — ED Provider Notes (Addendum)
[facility] Provider Note    Event Date/Time   First MD Initiated Contact with Patient 02/26/23 0036     (approximate)   History   Shortness of Breath   HPI Level 5 caveat:  history/ROS limited by patient acute/critical illness  Madison Benson is a 82 y.o. female with no known cardiac or lung disease.  She presents by EMS for acute onset and severe shortness of breath.  She was headed towards bed this evening when all of a sudden she became very short of breath.  She could not get enough air and EMS was called.  EMS reports that her oxygen was 88% on room air and she was using accessory muscles and retracting.  They had to titrate up to 6 L of oxygen by nasal cannula to see an improvement in her SpO2 and when she ambulated just a few feet to get into the ambulance she had to get into a tripod position in order to breathe.  She is still in respiratory distress upon arrival and cannot speak in full sentences.  She said this may have happened once in the past but she is not sure why.  She has no chest pain or abdominal pain.  She has not passed out.  She has not been on any long trips recently and has no fever.     Physical Exam   Triage Vital Signs: ED Triage Vitals  Enc Vitals Group     BP --      Pulse Rate 02/26/23 0037 88     Resp 02/26/23 0041 (!) 24     Temp 02/26/23 0041 (!) 97.5 F (36.4 C)     Temp Source 02/26/23 0041 Oral     SpO2 02/26/23 0041 92 %     Weight 02/26/23 0038 77.1 kg (170 lb)     Height --      Head Circumference --      Peak Flow --      Pain Score --      Pain Loc --      Pain Edu? --      Excl. in [place]? --     Most recent vital signs: Vitals:   02/26/23 0230 02/26/23 0300  BP: (!) 164/89 (!) 158/79  Pulse: 84 85  Resp:  (!) 22  Temp:    SpO2: 95% 95%     General: Awake, severe respiratory distress. CV:  Good peripheral perfusion.  Regular rate and rhythm, normal heart sounds. Resp:  Patient is using  accessory muscles and retracting and is tachypneic.  However I do not identify any specific abnormal breath sounds and she seems to have good air movement with no wheezing or rails. Abd:  No distention.  No tenderness to palpation. Other:  No confusion or abnormal mental status.   ED Results / Procedures / Treatments   Labs (all labs ordered are listed, but only abnormal results are displayed) Labs Reviewed  BRAIN NATRIURETIC PEPTIDE - Abnormal; Notable for the following components:      Result Value   B Natriuretic Peptide 131.1 (*)    All other components within normal limits  COMPREHENSIVE METABOLIC PANEL - Abnormal; Notable for the following components:   Glucose, Bld 120 (*)    BUN 29 (*)    Creatinine, Ser 1.16 (*)    Total Protein 6.4 (*)    GFR, Estimated 47 (*)    All other components within normal limits  RESP PANEL BY  RT-PCR (RSV, FLU A&B, COVID)  [id number]  LACTIC ACID, PLASMA  LACTIC ACID, PLASMA  LIPASE, BLOOD  CBC WITH DIFFERENTIAL/PLATELET  BLOOD GAS, VENOUS  URINALYSIS, ROUTINE W REFLEX MICROSCOPIC  TROPONIN I (HIGH SENSITIVITY)  TROPONIN I (HIGH SENSITIVITY)     EKG  ED ECG REPORT I, Hinda Kehr, the attending physician, personally viewed and interpreted this ECG.  Date: 02/26/2023 EKG Time: 00: 40 Rate: 83 Rhythm: Sinus rhythm with multiple PVCs QRS Axis: normal Intervals: normal ST/T Wave abnormalities: Non-specific ST segment / T-wave changes, but no clear evidence of acute ischemia. Narrative Interpretation: no definitive evidence of acute ischemia; does not meet STEMI criteria.    RADIOLOGY I viewed and interpreted the patient's 1 view chest x-ray.  No acute abnormalities noted and the radiologist report is essentially normal as well, just noting low lung volumes.  I also viewed and interpreted the patient's CTA chest.  I see no evidence of PE nor of interstitial lung disease or pulmonary edema.  She has some lower lobe atelectasis as  identified by the radiologist but no evidence of emergent condition.    PROCEDURES:  Critical Care performed: Yes, see critical care procedure note(s)  .1-3 Lead EKG Interpretation  Performed by: Hinda Kehr, MD Authorized by: Hinda Kehr, MD     Interpretation: abnormal     ECG rate:  105   ECG rate assessment: tachycardic     Rhythm: sinus tachycardia     Ectopy: none     Conduction: normal   .1-3 Lead EKG Interpretation  Performed by: Hinda Kehr, MD Authorized by: Hinda Kehr, MD     Interpretation: normal     ECG rate:  85   ECG rate assessment: normal     Rhythm: sinus rhythm     Ectopy: none     Conduction: normal   .Critical Care  Performed by: Hinda Kehr, MD Authorized by: Hinda Kehr, MD   Critical care provider statement:    Critical care time (minutes):  45   Critical care time was exclusive of:  Separately billable procedures and treating other patients   Critical care was necessary to treat or prevent imminent or life-threatening deterioration of the following conditions:  Respiratory failure   Critical care was time spent personally by me on the following activities:  Development of treatment plan with patient or surrogate, evaluation of patient's response to treatment, examination of patient, obtaining history from patient or surrogate, ordering and performing treatments and interventions, ordering and review of laboratory studies, ordering and review of radiographic studies, pulse oximetry, re-evaluation of patient's condition and review of old charts    MEDICATIONS ORDERED IN ED: Medications  iohexol (OMNIPAQUE) 350 MG/ML injection 75 mL (75 mLs Intravenous Contrast Given 02/26/23 0215)     IMPRESSION / MDM / [place] / ED COURSE  I reviewed the triage vital signs and the nursing notes.                              Differential diagnosis includes, but is not limited to, acute new onset CHF with pulmonary edema, ACS, PE,  pneumonia, pneumothorax.  Patient's presentation is most consistent with acute presentation with potential threat to life or bodily function.  Labs/studies ordered: 1 view chest x-ray, EKG, CBC with differential, CMP, BNP, high-sensitivity troponin, lactic acid, VBG, respiratory viral panel, lipase. Interventions/Medications given: BiPAP [facility] Course my include additional interventions not listed in this section:)  Symptoms  most consistent with flash pulmonary edema.  No known CHF.  Patient is working hard to breathe and had hypoxia and is obviously tired.  I ordered BiPAP in addition to her supplementary oxygen.  No indication she would benefit from albuterol or steroids.  Labs as described above are pending.  Patient is awake and alert and has no altered mental status and is protecting her airway; does not need intubation at this time.  The patient is on the cardiac monitor to evaluate for evidence of arrhythmia and/or significant heart rate changes.   Clinical Course as of 02/26/23 0350  Wed Feb 26, 2023  0200 The patient looks and feels better and is irritated by the BiPAP but is able to speak in full sentences.  Labs are all essentially normal other than a very slightly elevated BNP at 131 and a very slight creatinine elevation at 1.16.  No obvious pulmonary edema on chest x-ray.  I will proceed with CTA chest to rule out pulmonary embolism. [CF]  0218 The patient off of BiPAP and trialing on 4 L of oxygen by nasal cannula in order to proceed with CTA chest [CF]  0324 CT scan is reassuring as described above.  No clear indication for the patient's respiratory failure with hypoxia unless she has new onset CHF.  She is stable on 4 L of oxygen by nasal cannula.  I am consulting the hospitalist for admission after discussing the situation with the patient and her husband. [CF]  [id number] Consulted Dr. Sidney Ace with the hospitalist service. He will admit the patient. [CF]    Clinical Course User  Index [CF] Hinda Kehr, MD     FINAL CLINICAL IMPRESSION(S) / ED DIAGNOSES   Final diagnoses:  Acute respiratory failure with hypoxia ([place])  Elevated brain natriuretic peptide (BNP) level     Rx / DC Orders   ED Discharge Orders     None        Note:  This document was prepared using Dragon voice recognition software and may include unintentional dictation errors.   Hinda Kehr, MD 02/26/23 [id number]    Hinda Kehr, MD 02/26/23 [phone]

## 2023-02-26 NOTE — Progress Notes (Signed)
PT Cancellation Note  Patient Details Name: Madison Benson MRN: [id number] DOB: Jan 10, 1941   Cancelled Treatment:    Reason Eval/Treat Not Completed: Patient at procedure or test/unavailable (Chart reviewed, RN consulted. Pt having echo done at bedside. Will attempt evaluation again at later date/time.)  4:43 PM, 02/26/23 Etta Grandchild, PT, DPT Physical Therapist - [facility]  [phone] [facility])    [place] C 02/26/2023, 4:43 PM

## 2023-02-26 NOTE — H&P (Signed)
[place]   PATIENT NAME: Madison Benson    MR#:  [id number]  DATE OF BIRTH:  Madison Benson 04, 1942  DATE OF ADMISSION:  02/26/2023  PRIMARY CARE PHYSICIAN: Maryland Pink, MD   Patient is coming from: Home  REQUESTING/REFERRING PHYSICIAN: Hinda Kehr, MD  CHIEF COMPLAINT:   Chief Complaint  Patient presents with   Shortness of Breath    HISTORY OF PRESENT ILLNESS:  Madison Benson is a 82 y.o. female with medical history significant for chronic back pain, hypertension and dyslipidemia, who presented to the emergency room with acute onset of dyspnea over the last couple of days with severe shortness of breath that occurred suddenly when he was headed towards bed this evening when she called EMS.  She was reported to have a pulse oximetry of 88% on room air per EMS and was using accessory muscles and having intercostal retractions.  They titrated oxygen to 6 L by nasal cannula to improve pulse oximetry but ambulating for a few feet put her to severe distress and she had to get in a tripod position to be able to breathe.  Upon arrival to the ER she was still in respiratory distress and could not speak in full sentences.  She admits to cough occasionally productive of clear sputum and has been having rhinorrhea last night.  She denies any wheezing.  She had COVID in November stated that she had wheezing after that for which she has been using an inhaler.  She admits to nausea without vomiting or diarrhea.  No fever or chills.  She has been having urinary frequency and called her PCP and was prescribed Macrobid over the phone which she took for the first dose at 5 PM yesterday and her dyspnea started at 10:30 PM.  She continues to smoke cigarettes  ED Course: When she came to the ER, BP was 156/89 with respiratory rate of 23 and pulse oximetry 92 to 93% on 6 L O2 nasal cannula with a temperature 97.5.  Labs revealed a BUN of 29 creatinine 1.16 close to previous levels and blood glucose  of 120 with total protein 6.4 and albumin 4 with otherwise unremarkable CMP.  VBG showed pH 7.37 with HCO3 of 27.2 with pCO2 47 and pO2 41.  BN P was 131.1 and high sensitive troponin I 10 and later 15.  Lactic acid was 1 and later 0.8.  CBC was within normal.  EKG as reviewed by me : EKG showed sinus rhythm with a rate of 83 with multiform PVCs, left anterior fascicular block and poor R wave progression with probable left ventricular hypertrophy and T wave inversion laterally  Imaging: Chest x-ray showed no acute cardiopulmonary disease.   Chest CTA revealed the following: 1. No evidence of pulmonary embolism or other acute intrathoracic process. 2. Mild posterior bilateral lower lobe atelectasis. 3. Marked severity coronary artery calcification. 4. Extensive postoperative changes within the lower cervical and upper thoracic spine, as described above. 5. Left shoulder prosthesis. 6. Aortic atherosclerosis.  The patient was given 40 mg of IV Lasix.  She will be admitted to a progressive unit bed for further evaluation and management.   PAST MEDICAL HISTORY:   Past Medical History:  Diagnosis Date   Chronic back pain    HLD (hyperlipidemia)    Hypertension     PAST SURGICAL HISTORY:   Past Surgical History:  Procedure Laterality Date   BACK SURGERY    Left shoulder surgery.  SOCIAL HISTORY:   Social  History   Tobacco Use   Smoking status: Never   Smokeless tobacco: Never  Substance Use Topics   Alcohol use: Yes    Alcohol/week: 5.0 standard drinks of alcohol    Types: 5 Glasses of wine per week  She is a chronic cigarette smoker and continues to smoke every now and then.  FAMILY HISTORY:   Family History  Problem Relation Age of Onset   Hypertension Other     DRUG ALLERGIES:   Allergies  Allergen Reactions   Latex Rash    After band aid left for couple days After band aid left for couple days After band aid left for couple days    Nsaids Diarrhea and  Nausea And Vomiting   Sulfa Antibiotics Diarrhea and Nausea And Vomiting   Ciprofloxacin Itching   Diclofenac Sodium Diarrhea   Naproxen Diarrhea    Any anti-inflammatory causes severe diarrhea Any anti-inflammatory causes severe diarrhea Any anti-inflammatory causes severe diarrhea     REVIEW OF SYSTEMS:   ROS As per history of present illness. All pertinent systems were reviewed above. Constitutional, HEENT, cardiovascular, respiratory, GI, GU, musculoskeletal, neuro, psychiatric, endocrine, integumentary and hematologic systems were reviewed and are otherwise negative/unremarkable except for positive findings mentioned above in the HPI.   MEDICATIONS AT HOME:   Prior to Admission medications   Medication Sig Start Date End Date Taking? Authorizing Provider  albuterol (VENTOLIN HFA) 108 (90 Base) MCG/ACT inhaler Inhale 1-2 puffs into the lungs every 4 (four) hours as needed. 01/14/23  Yes [provider]  amLODipine (NORVASC) 2.5 MG tablet Take 2.5 mg by mouth daily. 04/22/22  Yes [provider]  aspirin 81 MG EC tablet Take 81 mg by mouth daily.   Yes [provider]  Calcium-Vitamin D-Vitamin K [id number] MG-UNT-MCG TABS Take 1 tablet by mouth daily.   Yes [provider]  cholecalciferol (VITAMIN D) 1000 units tablet Take 1,000 Units by mouth 2 (two) times daily.   Yes [provider]  DULoxetine (CYMBALTA) 60 MG capsule Take 60 mg by mouth at bedtime.   Yes [provider]  fentaNYL (DURAGESIC - DOSED MCG/HR) 50 MCG/HR Place 12 mcg onto the skin every 3 (three) days.   Yes [provider]  Ferrous Sulfate Dried 45 MG TBCR Take 45 mg by mouth daily.   Yes [provider]  lactobacillus acidophilus (BACID) TABS tablet Take 1 tablet by mouth daily.   Yes [provider]  losartan (COZAAR) 100 MG tablet Take 50 mg by mouth daily.   Yes [provider]  Magnesium 100 MG TABS Take 100 mg by mouth  daily.   Yes [provider]  Multiple Vitamins-Minerals (PRESERVISION AREDS 2 PO) Take 1 tablet by mouth daily.   Yes [provider]  nitrofurantoin, macrocrystal-monohydrate, (MACROBID) 100 MG capsule Take 100 mg by mouth 2 (two) times daily. 02/25/23 03/07/23 Yes [provider]  omeprazole (PRILOSEC) 20 MG capsule Take 20 mg by mouth daily.   Yes [provider]  Oxycodone HCl 10 MG TABS Take 10 mg by mouth 3 (three) times daily.   Yes [provider]  polyethylene glycol (MIRALAX / GLYCOLAX) 17 g packet Take 17 g by mouth daily.   Yes [provider]  simvastatin (ZOCOR) 20 MG tablet Take 20 mg by mouth at bedtime. 02/07/23  Yes [provider]  solifenacin (VESICARE) 5 MG tablet Take 5 mg by mouth daily. 06/28/22  Yes [provider]  tiZANidine (ZANAFLEX) 4  MG tablet Take 4 mg by mouth 2 (two) times daily.   Yes [provider]  glucose blood test strip 1 each by Other route as needed for other. Use as instructed    [provider]      VITAL SIGNS:  Blood pressure (!) 174/78, pulse 84, temperature 98.7 F (37.1 C), temperature source Oral, resp. rate (!) 22, weight 77.1 kg, SpO2 97 %.  PHYSICAL EXAMINATION:  Physical Exam  GENERAL:  82 y.o.-year-old patient lying in the bed with mild respiratory distress with conversational dyspnea.  EYES: Pupils equal, round, reactive to light and accommodation. No scleral icterus. Extraocular muscles intact.  HEENT: Head atraumatic, normocephalic. Oropharynx and nasopharynx clear.  NECK:  Supple, no jugular venous distention. No thyroid enlargement, no tenderness.  LUNGS: Diminished bibasal breath sounds with expiratory and expiratory rhonchi and mild expiratory wheezes with diminished expiratory airflow.  No use of accessory muscles of respiration.  CARDIOVASCULAR: Regular rate and rhythm, S1, S2 normal. No murmurs, rubs, or gallops.  ABDOMEN: Soft, nondistended,  nontender. Bowel sounds present. No organomegaly or mass.  EXTREMITIES: 1+ bilateral lower extremity pitting edema more on the right than the left leg with no cyanosis, or clubbing.  NEUROLOGIC: Cranial nerves II through XII are intact. Muscle strength 5/5 in all extremities. Sensation intact. Gait not checked.  PSYCHIATRIC: The patient is alert and oriented x 3.  Normal affect and good eye contact. SKIN: Bilateral lower extremity erythema with mild induration and warmth without tenderness that is chronic for her per her report more on the right than the left.  LABORATORY PANEL:   CBC Recent Labs  Lab 02/26/23 0455  WBC 11.5*  HGB 11.8*  HCT 38.8  PLT 191   ------------------------------------------------------------------------------------------------------------------  Chemistries  Recent Labs  Lab 02/26/23 0045 02/26/23 0455  NA 140 139  K 3.8 4.1  CL 106 105  CO2 23 24  GLUCOSE 120* 124*  BUN 29* 32*  CREATININE 1.16* 1.18*  CALCIUM 8.9 8.9  AST 27  --   ALT 24  --   ALKPHOS 122  --   BILITOT 0.5  --    ------------------------------------------------------------------------------------------------------------------  Cardiac Enzymes No results for input(s): "TROPONINI" in the last 168 hours. ------------------------------------------------------------------------------------------------------------------  RADIOLOGY:  CT Angio Chest PE W/Cm &/Or Wo Cm  Result Date: 02/26/2023 CLINICAL DATA:  Shortness of breath. EXAM: CT ANGIOGRAPHY CHEST WITH CONTRAST TECHNIQUE: Multidetector CT imaging of the chest was performed using the standard protocol during bolus administration of intravenous contrast. Multiplanar CT image reconstructions and MIPs were obtained to evaluate the vascular anatomy. RADIATION DOSE REDUCTION: This exam was performed according to the departmental dose-optimization program which includes automated exposure control, adjustment of the mA and/or kV  according to patient size and/or use of iterative reconstruction technique. CONTRAST:  68m OMNIPAQUE IOHEXOL 350 MG/ML SOLN COMPARISON:  October 18, 2017 FINDINGS: Cardiovascular: There is marked severity calcification of the aortic arch and descending thoracic aorta, without evidence of aortic aneurysm. Satisfactory opacification of the pulmonary arteries to the segmental level. No evidence of pulmonary embolism. Normal heart size with marked severity coronary artery calcification. No pericardial effusion. Mediastinum/Nodes: Clusters of subcentimeter left hilar and subcarinal lymph nodes are seen. Thyroid gland, trachea, and esophagus demonstrate no significant findings. Lungs/Pleura: Mild posterior bilateral lower lobe atelectasis is seen with moderate severity bronchiectasis. A 9 mm calcified lung nodule is noted within the lateral aspect of the left lower lobe. There is no evidence of a pleural effusion or pneumothorax. Upper Abdomen:  Punctate calcified granulomas are seen scattered throughout an otherwise normal-appearing spleen. Musculoskeletal: A left shoulder prosthesis is seen with associated streak artifact. Extensive postoperative changes are seen within the lower cervical and upper thoracic spine, with additional postoperative changes seen throughout the visualized portion of the lumbar spine. Review of the MIP images confirms the above findings. IMPRESSION: 1. No evidence of pulmonary embolism or other acute intrathoracic process. 2. Mild posterior bilateral lower lobe atelectasis. 3. Marked severity coronary artery calcification. 4. Extensive postoperative changes within the lower cervical and upper thoracic spine, as described above. 5. Left shoulder prosthesis. 6. Aortic atherosclerosis. Aortic Atherosclerosis (ICD10-I70.0). Electronically Signed   By: Virgina Norfolk M.D.   On: 02/26/2023 02:32   DG Chest Portable 1 View  Result Date: 02/26/2023 CLINICAL DATA:  COVID positive with shortness  of breath. EXAM: PORTABLE CHEST 1 VIEW COMPARISON:  October 18, 2017 FINDINGS: The heart size and mediastinal contours are within normal limits. There is marked severity calcification of the thoracic aorta. Low lung volumes are noted. A stable calcified lung nodule is seen within the left lung base. Mild, diffusely increased lung markings are seen without evidence of focal consolidation, pleural effusion or pneumothorax. A left shoulder prosthesis is seen. Multilevel degenerative changes are seen throughout the thoracic spine with postoperative changes noted within the lower thoracic and upper lumbar spine. IMPRESSION: Low lung volumes without evidence of acute cardiopulmonary disease. Electronically Signed   By: Virgina Norfolk M.D.   On: 02/26/2023 01:33      IMPRESSION AND PLAN:  Assessment and Plan: Acute respiratory failure with hypoxia (HCC) - Differential diagnosis would include new onset acute CHF likely diastolic, given mildly elevated BNP, interstitial pulmonary fibrosis related to Macrobid, reactive airway disease and COPD exacerbation given her persistent smoking. - She will be admitted to a progressive unit bed. - We will place on the with IV Lasix. - We will obtain a 2D echo. - Will place on steroid therapy with IV Solu-Medrol. - She will be placed on DuoNebs 4 times daily and every 4 hours as needed. - O2 protocol will be followed. -Depending on results and management response will defer cardiology +/- pulmonology consultation to the morning hospitalist.  Tobacco abuse - She was counseled for smoking cessation and will receive further counseling here.  Dyslipidemia - We will continue statin therapy.  GERD without esophagitis - We will continue PPI therapy especially in the setting of steroid therapy.  Chronic back pain - We will continue her home pain therapy.  Essential hypertension - We will continue her antihypertensives.   DVT prophylaxis: Lovenox.  Advanced Care  Planning:  Code Status: full code.  Family Communication:  The plan of care was discussed in details with the patient (and family). I answered all questions. The patient agreed to proceed with the above mentioned plan. Further management will depend upon hospital course. Disposition Plan: Back to previous home environment Consults called: none yet  All the records are reviewed and case discussed with ED provider.  Status is: Inpatient.   At the time of the admission, it appears that the appropriate admission status for this patient is inpatient.  This is judged to be reasonable and necessary in order to provide the required intensity of service to ensure the patient's safety given the presenting symptoms, physical exam findings and initial radiographic and laboratory data in the context of comorbid conditions.  The patient requires inpatient status due to high intensity of service, high risk of further deterioration and  high frequency of surveillance required.  I certify that at the time of admission, it is my clinical judgment that the patient will require inpatient hospital care extending more than 2 midnights.                            Dispo: The patient is from: Home              Anticipated d/c is to: Home              Patient currently is not medically stable to d/c.              Difficult to place patient: No  Christel Mormon M.D on 02/26/2023 at 7:08 AM  [facility]   From 7 PM-7 AM, contact night-coverage [email or web address]  CC: Primary care physician; [place] Pink, MD

## 2023-02-26 NOTE — Progress Notes (Signed)
Care started prior to midnight in the emergency room and patient admitted early this morning after midnight by Dr. Eugenie Norrie and I am in current agreement with his assessment and plan.  Additional changes to plan of care been made accordingly.  The patient is a 82 year old Caucasian female with a past medical history significant for benign to current tobacco abuse, chronic back pain, hypertension, dyslipidemia as well as other comorbidities presented to the ED with shortness of breath that occurred suddenly when she was headed towards bed that evening as she had calling EMS.  She reported to have a pulse oximetry of 88% on room air and was using accessory muscles and having intercostal retractions.  They placed her on oxygen and titrated to 6 L by nasal cannula but she continued to have significant severe distress even ambulating a few feet and had to get in tripod position to help her breathe.  Upon arrival to the ED she was still in respiratory distress and was not able to speak in full sentences.  She admitted to cough occasionally having a productive clear sputum and had been having rhinorrhea last night.  She had COVID in November and she states that after that she started wheezing and has been using inhaler.  She admitted to having some nausea without diarrhea and no fevers or chills.  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  In the ED she is given IV Lasix and was admitted to the progressive unit for further evaluation and management.  Currently she being admitted and treated for the following but not limited to:  Acute respiratory failure with hypoxia ([place]) -  Differential diagnosis would include new onset acute CHF likely diastolic, given mildly elevated BNP, interstitial pulmonary fibrosis related to Macrobid, reactive airway disease and COPD exacerbation given her persistent smoking. - She will be admitted to a progressive unit bed. - We will place on the with IV Lasix and she received 1 dose but then became hypotensive so hold further dosing. - We will obtain a 2D echo and this is currently being done - Will place on steroid therapy with IV Solu-Medrol. - She will be placed on DuoNebs 4 times daily and every 4 hours as needed. - O2 protocol will be followed. -Depending on results and management response will defer at this time cardiology +/- pulmonology consultation but currently awaiting echocardiogram results   Tobacco abuse - She was counseled for smoking cessation and will receive further counseling here.   Dyslipidemia - We will continue statin therapy with simvastatin 20 mg p.o. nightly.   GERD without esophagitis - We will continue PPI therapy especially in the setting of steroid therapy.   Chronic back pain - We will continue her home pain therapy.   Essential hypertension - We will continue her antihypertensives however she became hypotensive so we will hold these now and give her a bolus of fluid Picture monitor blood pressures per protocol  Leukocytosis -Likely in the setting of steroid margination -Given a dose of Solu-Medrol 125 mg x 1 and then placed on 40 mg every 12 -Check blood cultures x 2 and respiratory virus panel -Continue to monitor and will empirically place on doxycycline  Normocytic  Anemia -Hgb/Hct Trend: Recent Labs  Lab 02/26/23 0045 02/26/23 0455  HGB 12.9 11.8*  HCT 41.3 38.8  MCV 95.4 98.7  -Check Anemia Panel in the AM -Continue to Monitor for S/Sx of Bleeding; no overt bleeding noted -Repeat CBC in the AM   Obesity -Complicates overall prognosis and care -Estimated body mass index is 33.2  kg/m as calculated from the following:   Height as of 10/29/22: 5' (1.524 m).   Weight as of this encounter: 77.1 kg.  -Weight Loss and Dietary Counseling given  Will continue to monitor patient's clinical response to intervention reviewed blood work and imaging in the a.m.

## 2023-02-26 NOTE — ED Notes (Signed)
Pt. Up to bedside commode x1 assist. Pt. Gait steady, NAD.

## 2023-02-26 NOTE — ED Notes (Signed)
Pt complaining that wants to walk in room and use toilet to urinate. Pt became very SOB with night RN when got up to sit on toilet. Pt states purewick is uncomfortable.Advised pt that purewick is positioned appropriately and should try to use purewick.

## 2023-02-26 NOTE — ED Notes (Addendum)
Pt's spouse left to go home, this RN checks in on pt. Pt. Laying in bed, using phone.  Pt. States she is much calmer and feels much better, is pleasant and conversational. Ice provided to pt. Per request. Denies any further need.

## 2023-02-26 NOTE — Assessment & Plan Note (Signed)
-   We will continue PPI therapy especially in the setting of steroid therapy.

## 2023-02-26 NOTE — Assessment & Plan Note (Signed)
-   We will continue her antihypertensives. 

## 2023-02-26 NOTE — Progress Notes (Signed)
Anticoagulation monitoring(Lovenox):  82 yo female ordered Lovenox 40 mg Q24h    Filed Weights   02/26/23 0038  Weight: 77.1 kg (170 lb)   BMI 33.2    Lab Results  Component Value Date   CREATININE 1.16 (H) 02/26/2023   CREATININE 1.19 (H) 03/02/2020   CREATININE 1.11 (H) 10/20/2017   Estimated Creatinine Clearance: 34.9 mL/min (A) (by C-G formula based on SCr of 1.16 mg/dL (H)). Hemoglobin & Hematocrit     Component Value Date/Time   HGB 12.9 02/26/2023 0045   HCT 41.3 02/26/2023 0045     Per Protocol for Patient with estCrcl  > 30 ml/min and BMI > 30, will transition to Lovenox 37.5 mg Q24h.

## 2023-02-26 NOTE — ED Notes (Signed)
Patient removed from BiPAP at this time by Karma Greaser, MD.

## 2023-02-26 NOTE — ED Notes (Signed)
First encounter with pt. Pt appears mildly tachypneic but denies feeling SOB at this time. Pt on 4L oxygen. Asking to walk to toilet. Pt has purewick in place. Explained to pt that according to night RN, \pt became extremely SOB when ambulating to toilet last time and that purewick is better option at this time. Pt agrees.

## 2023-02-26 NOTE — ED Notes (Addendum)
Pt A&Ox4. Pt requesting to get up and go to the bathroom. Informed patient PT was consulted and they would be getting her up when they came to see how she is progressing since last time she ambulated got extremely hard to breathe. Breathing is currently symmetrical and unlabored. Resting in bed.

## 2023-02-26 NOTE — ED Notes (Signed)
Patient and family updated on plan of care at this time.

## 2023-02-27 ENCOUNTER — Inpatient Hospital Stay: Payer: Medicare Other

## 2023-02-27 DIAGNOSIS — J9601 Acute respiratory failure with hypoxia: Secondary | ICD-10-CM | POA: Diagnosis not present

## 2023-02-27 LAB — CBC WITH DIFFERENTIAL/PLATELET
Abs Immature Granulocytes: 0.1 10*3/uL — ABNORMAL HIGH (ref 0.00–0.07)
Basophils Absolute: 0 10*3/uL (ref 0.0–0.1)
Basophils Relative: 0 %
Eosinophils Absolute: 0 10*3/uL (ref 0.0–0.5)
Eosinophils Relative: 0 %
HCT: 33.8 % — ABNORMAL LOW (ref 36.0–46.0)
Hemoglobin: 11.1 g/dL — ABNORMAL LOW (ref 12.0–15.0)
Immature Granulocytes: 2 %
Lymphocytes Relative: 7 %
Lymphs Abs: 0.5 10*3/uL — ABNORMAL LOW (ref 0.7–4.0)
MCH: 30.6 pg (ref 26.0–34.0)
MCHC: 32.8 g/dL (ref 30.0–36.0)
MCV: 93.1 fL (ref 80.0–100.0)
Monocytes Absolute: 0.1 10*3/uL (ref 0.1–1.0)
Monocytes Relative: 2 %
Neutro Abs: 6 10*3/uL (ref 1.7–7.7)
Neutrophils Relative %: 89 %
Platelets: 203 10*3/uL (ref 150–400)
RBC: 3.63 MIL/uL — ABNORMAL LOW (ref 3.87–5.11)
RDW: 13.4 % (ref 11.5–15.5)
WBC: 6.7 10*3/uL (ref 4.0–10.5)
nRBC: 0 % (ref 0.0–0.2)

## 2023-02-27 LAB — RESPIRATORY PANEL BY PCR

## 2023-02-27 LAB — COMPREHENSIVE METABOLIC PANEL
ALT: 20 U/L (ref 0–44)
AST: 23 U/L (ref 15–41)
Albumin: 3.3 g/dL — ABNORMAL LOW (ref 3.5–5.0)
Alkaline Phosphatase: 78 U/L (ref 38–126)
Anion gap: 8 (ref 5–15)
BUN: 43 mg/dL — ABNORMAL HIGH (ref 8–23)
CO2: 23 mmol/L (ref 22–32)
Calcium: 8.7 mg/dL — ABNORMAL LOW (ref 8.9–10.3)
Chloride: 107 mmol/L (ref 98–111)
Creatinine, Ser: 1.25 mg/dL — ABNORMAL HIGH (ref 0.44–1.00)
GFR, Estimated: 43 mL/min — ABNORMAL LOW (ref >60)
Glucose, Bld: 170 mg/dL — ABNORMAL HIGH (ref 70–99)
Potassium: 4.1 mmol/L (ref 3.5–5.1)
Sodium: 138 mmol/L (ref 135–145)
Total Bilirubin: 0.5 mg/dL (ref 0.3–1.2)
Total Protein: 5.8 g/dL — ABNORMAL LOW (ref 6.5–8.1)

## 2023-02-27 LAB — MAGNESIUM: Magnesium: 2.4 mg/dL (ref 1.7–2.4)

## 2023-02-27 LAB — FOLATE: Folate: 6.7 ng/mL (ref >5.9)

## 2023-02-27 LAB — RETICULOCYTES
Immature Retic Fract: 13.3 % (ref 2.3–15.9)
RBC.: 3.63 MIL/uL — ABNORMAL LOW (ref 3.87–5.11)
Retic Count, Absolute: 58.4 10*3/uL (ref 19.0–186.0)
Retic Ct Pct: 1.6 % (ref 0.4–3.1)

## 2023-02-27 LAB — IRON AND TIBC
Iron: 59 ug/dL (ref 28–170)
Saturation Ratios: 21 % (ref 10.4–31.8)
TIBC: 276 ug/dL (ref 250–450)
UIBC: 217 ug/dL

## 2023-02-27 LAB — VITAMIN B12: Vitamin B-12: 177 pg/mL — ABNORMAL LOW (ref 180–914)

## 2023-02-27 LAB — PHOSPHORUS: Phosphorus: 3.8 mg/dL (ref 2.5–4.6)

## 2023-02-27 LAB — FERRITIN: Ferritin: 142 ng/mL (ref 11–307)

## 2023-02-27 MED ORDER — ARFORMOTEROL TARTRATE 15 MCG/2ML IN NEBU
15.0000 ug | INHALATION_SOLUTION | Freq: Two times a day (BID) | RESPIRATORY_TRACT | Status: DC
  Administered 2023-02-27: 15 ug via RESPIRATORY_TRACT
  Filled 2023-02-27 (×2): qty 2

## 2023-02-27 MED ORDER — PREDNISONE 20 MG PO TABS: 40.0000 mg | ORAL_TABLET | Freq: Every day | ORAL | 0 refills | Status: AC

## 2023-02-27 MED ORDER — BUDESONIDE 0.25 MG/2ML IN SUSP
0.2500 mg | Freq: Two times a day (BID) | RESPIRATORY_TRACT | Status: DC
  Administered 2023-02-27: 0.25 mg via RESPIRATORY_TRACT
  Filled 2023-02-27: qty 2

## 2023-02-27 MED ORDER — ALBUTEROL SULFATE HFA 108 (90 BASE) MCG/ACT IN AERS: 1.0000 | INHALATION_SPRAY | RESPIRATORY_TRACT | 0 refills | Status: AC | PRN

## 2023-02-27 NOTE — Care Management CC44 (Signed)
Condition Code 44 Documentation Completed  Patient Details  Name: Madison Benson MRN: [id number] Date of Birth: 02/16/1941   Condition Code 44 given:  Yes Patient signature on Condition Code 44 notice:  Yes Documentation of 2 MD's agreement:  Yes Code 44 added to claim:  Yes    Ross Ludwig, LCSW 02/27/2023, 12:26 PM

## 2023-02-27 NOTE — Discharge Summary (Signed)
Physician Discharge Summary  Madison Benson [id number] DOB: 03-Feb-1941 DOA: 02/26/2023  PCP: Maryland Pink, MD  Admit date: 02/26/2023 Discharge date: 02/27/2023  Admitted From: Home Disposition:  Home with home health  Recommendations for Outpatient Follow-up:  Follow up with PCP in 1-2 weeks Outpatient referral to pulmonology  Home Health: Yes PT OT RN Equipment/Devices:None  Discharge Condition:Stable  CODE STATUS:FULL  Diet recommendation: Reg  Brief/Interim Summary: 82 y.o. female with medical history significant for chronic back pain, hypertension and dyslipidemia, who presented to the emergency room with acute onset of dyspnea over the last couple of days with severe shortness of breath that occurred suddenly when he was headed towards bed this evening when she called EMS.  She was reported to have a pulse oximetry of 88% on room air per EMS and was using accessory muscles and having intercostal retractions.  They titrated oxygen to 6 L by nasal cannula to improve pulse oximetry but ambulating for a few feet put her to severe distress and she had to get in a tripod position to be able to breathe.  Upon arrival to the ER she was still in respiratory distress and could not speak in full sentences.  She admits to cough occasionally productive of clear sputum and has been having rhinorrhea last night.  She denies any wheezing.  She had COVID in November stated that she had wheezing after that for which she has been using an inhaler.  She admits to nausea without vomiting or diarrhea.  No fever or chills.  She has been having urinary frequency and called her PCP and was prescribed Macrobid over the phone which she took for the first dose at 5 PM yesterday and her dyspnea started at 10:30 PM.  She continues to smoke cigarettes   Echocardiogram revealed normal ejection fraction with grade 1 diastolic dysfunction.  No evidence of acute heart failure at time of my evaluation on 2/29.   Suspect underlying COPD as patient is an active smoker, smoking approximately 5 cigarettes daily and has been doing so for many years.  She does not carry a formal diagnosis.  At time of discharge patient was ambulated and remained stable on room air without desaturation or shortness of breath.  She will be discharged home on a short course of prednisone, will resume her home albuterol MDI.  Outpatient referral to pulmonology for consideration of spirometry.    Discharge Diagnoses:  Principal Problem:   Acute respiratory failure with hypoxia (HCC) Active Problems:   Tobacco abuse   Dyslipidemia   Essential hypertension   GERD without esophagitis   Elevated brain natriuretic peptide (BNP) level  Acute respiratory failure with hypoxia There may be a small element of heart failure as patient does have grade 1 diastolic dysfunction however did not appear markedly fluid overloaded on exam with minimal/no elevation in BNP.  Interstitial pulmonary fibrosis is also consideration however the CAT scan findings speak away from this.  I suspect underlying early COPD.  Patient continues to smoke cigarettes.  She was counseled on cessation.  No diuretics on time of discharge.  Will discharge on albuterol MDI as needed and 5 days of p.o. prednisone.  Outpatient referral to pulmonology placed at time of DC.  Discharge Instructions  Discharge Instructions     Ambulatory referral to Pulmonology   Complete by: As directed    Reason for referral: Asthma/COPD   Diet - low sodium heart healthy   Complete by: As directed    Increase activity slowly  Complete by: As directed       Allergies as of 02/27/2023       Reactions   Latex Rash   After band aid left for couple days After band aid left for couple days After band aid left for couple days   Nsaids Diarrhea, Nausea And Vomiting   Sulfa Antibiotics Diarrhea, Nausea And Vomiting   Ciprofloxacin Itching   Diclofenac Sodium Diarrhea   Naproxen  Diarrhea   Any anti-inflammatory causes severe diarrhea Any anti-inflammatory causes severe diarrhea Any anti-inflammatory causes severe diarrhea        Medication List     TAKE these medications    albuterol 108 (90 Base) MCG/ACT inhaler Commonly known as: VENTOLIN HFA Inhale 1-2 puffs into the lungs every 4 (four) hours as needed.   amLODipine 2.5 MG tablet Commonly known as: NORVASC Take 5 mg by mouth at bedtime.   aspirin EC 81 MG tablet Take 81 mg by mouth daily.   Calcium-Vitamin D-Vitamin K [id number] MG-UNT-MCG Tabs Take 1 tablet by mouth daily.   cholecalciferol 1000 units tablet Commonly known as: VITAMIN D Take 1,000 Units by mouth 2 (two) times daily.   DULoxetine 60 MG capsule Commonly known as: CYMBALTA Take 60 mg by mouth at bedtime.   fentaNYL 50 MCG/HR Commonly known as: DURAGESIC Place 12 mcg onto the skin every 3 (three) days.   Ferrous Sulfate Dried 45 MG Tbcr Take 45 mg by mouth daily.   glucose blood test strip 1 each by Other route as needed for other. Use as instructed   lactobacillus acidophilus Tabs tablet Take 1 tablet by mouth daily.   losartan 100 MG tablet Commonly known as: COZAAR Take 50 mg by mouth daily.   Magnesium 100 MG Tabs Take 100 mg by mouth daily.   nitrofurantoin (macrocrystal-monohydrate) 100 MG capsule Commonly known as: MACROBID Take 100 mg by mouth 2 (two) times daily.   omeprazole 20 MG capsule Commonly known as: PRILOSEC Take 20 mg by mouth daily.   Oxycodone HCl 10 MG Tabs Take 10 mg by mouth 3 (three) times daily.   polyethylene glycol 17 g packet Commonly known as: MIRALAX / GLYCOLAX Take 17 g by mouth daily.   predniSONE 20 MG tablet Commonly known as: DELTASONE Take 2 tablets (40 mg total) by mouth daily for 5 days. Start taking on: February 28, 2023   PRESERVISION AREDS 2 PO Take 1 tablet by mouth daily.   simvastatin 20 MG tablet Commonly known as: ZOCOR Take 20 mg by mouth at  bedtime.   solifenacin 5 MG tablet Commonly known as: VESICARE Take 5 mg by mouth daily.   tiZANidine 4 MG tablet Commonly known as: ZANAFLEX Take 4 mg by mouth 2 (two) times daily.        Allergies  Allergen Reactions   Latex Rash    After band aid left for couple days After band aid left for couple days After band aid left for couple days    Nsaids Diarrhea and Nausea And Vomiting   Sulfa Antibiotics Diarrhea and Nausea And Vomiting   Ciprofloxacin Itching   Diclofenac Sodium Diarrhea   Naproxen Diarrhea    Any anti-inflammatory causes severe diarrhea Any anti-inflammatory causes severe diarrhea Any anti-inflammatory causes severe diarrhea     Consultations: None   Procedures/Studies: DG Chest Port 1 View  Result Date: 02/27/2023 CLINICAL DATA:  Shortness of breath EXAM: PORTABLE CHEST 1 VIEW COMPARISON:  Radiograph and CT 02/26/2023 FINDINGS: Low lung volumes accentuate  cardiomediastinal silhouette and pulmonary vascularity. Stable cardiomediastinal silhouette. Aortic atherosclerotic calcification. No focal consolidation. Possible small left pleural effusion or pleural thickening. 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 Cervical thoracic spine fusion hardware. IMPRESSION: No acute abnormality.  Low lung volumes. Electronically Signed   By: Placido Sou M.D.   On: 02/27/2023 00:30   ECHOCARDIOGRAM COMPLETE  Result Date: 02/26/2023    ECHOCARDIOGRAM REPORT   Patient Name:   Madison Benson Date of Exam: 02/26/2023 Medical Rec #:  [id number]            Height:       60.0 in Accession #:    [id number]           Weight:       170.0 lb Date of Birth:  07-19-1941             BSA:          1.742 m Patient Age:    27 years             BP:           87/77 mmHg Patient Gender: F                    HR:           69 bpm. Exam Location:  [facility] Procedure: 2D Echo, Cardiac Doppler and Color Doppler Indications:     CHF  History:         Patient has  no prior history of Echocardiogram examinations.                  CHF, Abnormal ECG, Signs/Symptoms:Shortness of Breath, Chest                  Pain, Murmur and Edema; Risk Factors:Hypertension, Dyslipidemia                  and Current Smoker.  Sonographer:     Wenda Low Referring Phys:  [id number] [place] Diagnosing Phys: Isaias Cowman MD  Sonographer Comments: Image acquisition challenging due to respiratory motion. IMPRESSIONS  1. Left ventricular ejection fraction, by estimation, is 60 to 65%. The left ventricle has normal function. The left ventricle has no regional wall motion abnormalities. There is moderate left ventricular hypertrophy. Left ventricular diastolic parameters are consistent with Grade I diastolic dysfunction (impaired relaxation).  2. Right ventricular systolic function is normal. The right ventricular size is normal.  3. The mitral valve is normal in structure. Mild mitral valve regurgitation. No evidence of mitral stenosis.  4. The aortic valve is normal in structure. Aortic valve regurgitation is mild. No aortic stenosis is present.  5. The inferior vena cava is normal in size with greater than 50% respiratory variability, suggesting right atrial pressure of 3 mmHg. FINDINGS  Left Ventricle: Left ventricular ejection fraction, by estimation, is 60 to 65%. The left ventricle has normal function. The left ventricle has no regional wall motion abnormalities. The left ventricular internal cavity size was normal in size. There is  moderate left ventricular hypertrophy. Left ventricular diastolic parameters are consistent with Grade I diastolic dysfunction (impaired relaxation). Right Ventricle: The right ventricular size is normal. No increase in right ventricular wall thickness. Right ventricular systolic function is normal. Left Atrium: Left atrial size was normal in size. Right Atrium: Right atrial size was normal in size. Pericardium: There is no evidence of pericardial  effusion. Mitral Valve: The mitral valve is normal in structure.  Mild mitral valve regurgitation. No evidence of mitral valve stenosis. MV peak gradient, 5.7 mmHg. The mean mitral valve gradient is 2.0 mmHg. Tricuspid Valve: The tricuspid valve is normal in structure. Tricuspid valve regurgitation is mild . No evidence of tricuspid stenosis. Aortic Valve: The aortic valve is normal in structure. Aortic valve regurgitation is mild. No aortic stenosis is present. Aortic valve mean gradient measures 7.0 mmHg. Aortic valve peak gradient measures 15.7 mmHg. Aortic valve area, by VTI measures 2.26  cm. Pulmonic Valve: The pulmonic valve was normal in structure. Pulmonic valve regurgitation is not visualized. No evidence of pulmonic stenosis. Aorta: The aortic root is normal in size and structure. Venous: The inferior vena cava is normal in size with greater than 50% respiratory variability, suggesting right atrial pressure of 3 mmHg. IAS/Shunts: No atrial level shunt detected by color flow Doppler.  LEFT VENTRICLE PLAX 2D LVIDd:         4.40 cm   Diastology LVIDs:         2.80 cm   LV e' medial:    6.64 cm/s LV PW:         1.50 cm   LV E/e' medial:  12.2 LV IVS:        1.50 cm   LV e' lateral:   7.62 cm/s LVOT diam:     1.90 cm   LV E/e' lateral: 10.6 LV SV:         93 LV SV Index:   53 LVOT Area:     2.84 cm  RIGHT VENTRICLE RV Basal diam:  3.30 cm RV Mid diam:    2.80 cm RV S prime:     16.90 cm/s TAPSE (M-mode): 1.9 cm LEFT ATRIUM             Index        RIGHT ATRIUM           Index LA diam:        4.60 cm 2.64 cm/m   RA Area:     16.00 cm LA Vol (A2C):   55.1 ml 31.63 ml/m  RA Volume:   40.60 ml  23.31 ml/m LA Vol (A4C):   62.5 ml 35.88 ml/m LA Biplane Vol: 59.2 ml 33.98 ml/m  AORTIC VALVE                     PULMONIC VALVE AV Area (Vmax):    1.83 cm      PV Vmax:       1.00 m/s AV Area (Vmean):   2.21 cm      PV Peak grad:  4.0 mmHg AV Area (VTI):     2.26 cm AV Vmax:           198.00 cm/s AV Vmean:           122.000 cm/s AV VTI:            0.411 m AV Peak Grad:      15.7 mmHg AV Mean Grad:      7.0 mmHg LVOT Vmax:         128.00 cm/s LVOT Vmean:        95.100 cm/s LVOT VTI:          0.327 m LVOT/AV VTI ratio: 0.80  AORTA Ao Root diam: 3.30 cm MITRAL VALVE MV Area (PHT): 2.19 cm     SHUNTS MV Area VTI:   3.28 cm     Systemic VTI:  0.33  m MV Peak grad:  5.7 mmHg     Systemic Diam: 1.90 cm MV Mean grad:  2.0 mmHg MV Vmax:       1.19 m/s MV Vmean:      58.3 cm/s MV Decel Time: 346 msec MV E velocity: 80.70 cm/s MV A velocity: 132.00 cm/s MV E/A ratio:  0.61 Isaias Cowman MD Electronically signed by Isaias Cowman MD Signature Date/Time: 02/26/2023/4:33:30 PM    Final    CT Angio Chest PE W/Cm &/Or Wo Cm  Result Date: 02/26/2023 CLINICAL DATA:  Shortness of breath. EXAM: CT ANGIOGRAPHY CHEST WITH CONTRAST TECHNIQUE: Multidetector CT imaging of the chest was performed using the standard protocol during bolus administration of intravenous contrast. Multiplanar CT image reconstructions and MIPs were obtained to evaluate the vascular anatomy. RADIATION DOSE REDUCTION: This exam was performed according to the departmental dose-optimization program which includes automated exposure control, adjustment of the mA and/or kV according to patient size and/or use of iterative reconstruction technique. CONTRAST:  41m OMNIPAQUE IOHEXOL 350 MG/ML SOLN COMPARISON:  October 18, 2017 FINDINGS: Cardiovascular: There is marked severity calcification of the aortic arch and descending thoracic aorta, without evidence of aortic aneurysm. Satisfactory opacification of the pulmonary arteries to the segmental level. No evidence of pulmonary embolism. Normal heart size with marked severity coronary artery calcification. No pericardial effusion. Mediastinum/Nodes: Clusters of subcentimeter left hilar and subcarinal lymph nodes are seen. Thyroid gland, trachea, and esophagus demonstrate no significant findings. Lungs/Pleura: Mild  posterior bilateral lower lobe atelectasis is seen with moderate severity bronchiectasis. A 9 mm calcified lung nodule is noted within the lateral aspect of the left lower lobe. There is no evidence of a pleural effusion or pneumothorax. Upper Abdomen: Punctate calcified granulomas are seen scattered throughout an otherwise normal-appearing spleen. Musculoskeletal: A left shoulder prosthesis is seen with associated streak artifact. Extensive postoperative changes are seen within the lower cervical and upper thoracic spine, with additional postoperative changes seen throughout the visualized portion of the lumbar spine. Review of the MIP images confirms the above findings. IMPRESSION: 1. No evidence of pulmonary embolism or other acute intrathoracic process. 2. Mild posterior bilateral lower lobe atelectasis. 3. Marked severity coronary artery calcification. 4. Extensive postoperative changes within the lower cervical and upper thoracic spine, as described above. 5. Left shoulder prosthesis. 6. Aortic atherosclerosis. Aortic Atherosclerosis (ICD10-I70.0). Electronically Signed   By: TVirgina NorfolkM.D.   On: 02/26/2023 02:32   DG Chest Portable 1 View  Result Date: 02/26/2023 CLINICAL DATA:  COVID positive with shortness of breath. EXAM: PORTABLE CHEST 1 VIEW COMPARISON:  October 18, 2017 FINDINGS: The heart size and mediastinal contours are within normal limits. There is marked severity calcification of the thoracic aorta. Low lung volumes are noted. A stable calcified lung nodule is seen within the left lung base. Mild, diffusely increased lung markings are seen without evidence of focal consolidation, pleural effusion or pneumothorax. A left shoulder prosthesis is seen. Multilevel degenerative changes are seen throughout the thoracic spine with postoperative changes noted within the lower thoracic and upper lumbar spine. IMPRESSION: Low lung volumes without evidence of acute cardiopulmonary disease.  Electronically Signed   By: TVirgina NorfolkM.D.   On: 02/26/2023 01:33      Subjective: Seen and examined on the day of discharge.  Stable no distress.  Appropriate for discharge home.  Discharge Exam: Vitals:   02/27/23 1120 02/27/23 1200  BP: (!) 108/56 (!) 108/53  Pulse: 83 71  Resp: 20 14  Temp:  98.2 F (36.8 C)  SpO2: 96% 93%   Vitals:   02/27/23 0800 02/27/23 1043 02/27/23 1120 02/27/23 1200  BP: (!) 122/59  (!) 108/56 (!) 108/53  Pulse: 67  83 71  Resp: '16  20 14  '$ Temp: 98.4 F (36.9 C)   98.2 F (36.8 C)  TempSrc:      SpO2: 95% 95% 96% 93%  Weight:        General: Pt is alert, awake, not in acute distress Cardiovascular: RRR, S1/S2 +, no rubs, no gallops Respiratory: CTA bilaterally, no wheezing, no rhonchi Abdominal: Soft, NT, ND, bowel sounds + Extremities: no edema, no cyanosis    The results of significant diagnostics from this hospitalization (including imaging, microbiology, ancillary and laboratory) are listed below for reference.     Microbiology: Recent Results (from the past 240 hour(s))  Resp panel by RT-PCR (RSV, Flu A&B, Covid) Anterior Nasal Swab     Status: None   Collection Time: 02/26/23 12:48 AM   Specimen: Anterior Nasal Swab  Result Value Ref Range Status   SARS Coronavirus 2 by RT PCR NEGATIVE NEGATIVE Final    Comment: (NOTE) SARS-CoV-2 target nucleic acids are NOT DETECTED.  The SARS-CoV-2 RNA is generally detectable in upper respiratory specimens during the acute phase of infection. The lowest concentration of SARS-CoV-2 viral copies this assay can detect is 138 copies/mL. A negative result does not preclude SARS-Cov-2 infection and should not be used as the sole basis for treatment or other patient management decisions. A negative result may occur with  improper specimen collection/handling, submission of specimen other than nasopharyngeal swab, presence of viral mutation(s) within the areas targeted by this assay, and  inadequate number of viral copies(<138 copies/mL). A negative result must be combined with clinical observations, patient history, and epidemiological information. The expected result is Negative.  Fact Sheet for Patients:  EntrepreneurPulse.com.au  Fact Sheet for Healthcare Providers:  IncredibleEmployment.be  This test is no t yet approved or cleared by the [place] FDA and  has been authorized for detection and/or diagnosis of SARS-CoV-2 by FDA under an Emergency Use Authorization (EUA). This EUA will remain  in effect (meaning this test can be used) for the duration of the COVID-19 declaration under Section 564(b)(1) of the Act, 21 U.S.C.section 360bbb-3(b)(1), unless the authorization is terminated  or revoked sooner.       Influenza A by PCR NEGATIVE NEGATIVE Final   Influenza B by PCR NEGATIVE NEGATIVE Final    Comment: (NOTE) The Xpert Xpress SARS-CoV-2/FLU/RSV plus assay is intended as an aid in the diagnosis of influenza from Nasopharyngeal swab specimens and should not be used as a sole basis for treatment. Nasal washings and aspirates are unacceptable for Xpert Xpress SARS-CoV-2/FLU/RSV testing.  Fact Sheet for Patients: EntrepreneurPulse.com.au  Fact Sheet for Healthcare Providers: IncredibleEmployment.be  This test is not yet approved or cleared by the [place] FDA and has been authorized for detection and/or diagnosis of SARS-CoV-2 by FDA under an Emergency Use Authorization (EUA). This EUA will remain in effect (meaning this test can be used) for the duration of the COVID-19 declaration under Section 564(b)(1) of the Act, 21 U.S.C. section 360bbb-3(b)(1), unless the authorization is terminated or revoked.     Resp Syncytial Virus by PCR NEGATIVE NEGATIVE Final    Comment: (NOTE) Fact Sheet for Patients: EntrepreneurPulse.com.au  Fact Sheet for Healthcare  Providers: IncredibleEmployment.be  This test is not yet approved or cleared by the [place] and has been authorized  for detection and/or diagnosis of SARS-CoV-2 by FDA under an Emergency Use Authorization (EUA). This EUA will remain in effect (meaning this test can be used) for the duration of the COVID-19 declaration under Section 564(b)(1) of the Act, 21 U.S.C. section 360bbb-3(b)(1), unless the authorization is terminated or revoked.  Performed at [facility], [address] [place], [place] [address]   Culture, blood (Routine X 2) w Reflex to ID Panel     Status: None (Preliminary result)   Collection Time: 02/26/23  4:05 PM   Specimen: BLOOD  Result Value Ref Range Status   Specimen Description BLOOD BLOOD RIGHT HAND  Final   Special Requests   Final    BOTTLES DRAWN AEROBIC AND ANAEROBIC Blood Culture adequate volume   Culture   Final    NO GROWTH < 24 HOURS Performed at [facility], [address] [place], [place] [address]    Report Status PENDING  Incomplete  Culture, blood (Routine X 2) w Reflex to ID Panel     Status: None (Preliminary result)   Collection Time: 02/26/23  4:15 PM   Specimen: BLOOD  Result Value Ref Range Status   Specimen Description BLOOD BLOOD RIGHT FOREARM  Final   Special Requests   Final    BOTTLES DRAWN AEROBIC ONLY Blood Culture adequate volume   Culture   Final    NO GROWTH < 24 HOURS Performed at [facility], [address] [place], [place] [address]    Report Status PENDING  Incomplete  Respiratory (~20 pathogens) panel by PCR     Status: None   Collection Time: 02/26/23  6:20 PM   Specimen: Nasopharyngeal Swab; Respiratory  Result Value Ref Range Status   Adenovirus NOT DETECTED NOT DETECTED Final   Coronavirus 229E NOT DETECTED NOT DETECTED Final    Comment: (NOTE) The Coronavirus on the Respiratory Panel, DOES NOT test for the novel  Coronavirus (2019 nCoV)     Coronavirus HKU1 NOT DETECTED NOT DETECTED Final   Coronavirus NL63 NOT DETECTED NOT DETECTED Final   Coronavirus OC43 NOT DETECTED NOT DETECTED Final   Metapneumovirus NOT DETECTED NOT DETECTED Final   Rhinovirus / Enterovirus NOT DETECTED NOT DETECTED Final   Influenza A NOT DETECTED NOT DETECTED Final   Influenza B NOT DETECTED NOT DETECTED Final   Parainfluenza Virus 1 NOT DETECTED NOT DETECTED Final   Parainfluenza Virus 2 NOT DETECTED NOT DETECTED Final   Parainfluenza Virus 3 NOT DETECTED NOT DETECTED Final   Parainfluenza Virus 4 NOT DETECTED NOT DETECTED Final   Respiratory Syncytial Virus NOT DETECTED NOT DETECTED Final   Bordetella pertussis NOT DETECTED NOT DETECTED Final   Bordetella Parapertussis NOT DETECTED NOT DETECTED Final   Chlamydophila pneumoniae NOT DETECTED NOT DETECTED Final   Mycoplasma pneumoniae NOT DETECTED NOT DETECTED Final    Comment: Performed at [facility], [place] [address], [place], [place] [address]     Labs: BNP (last 3 results) Recent Labs    02/26/23 0048  BNP [id number]*   Basic Metabolic Panel: Recent Labs  Lab 02/26/23 0045 02/26/23 0455 02/27/23 0441  NA 140 139 138  K 3.8 4.1 4.1  CL 106 105 107  CO2 '23 24 23  '$ GLUCOSE 120* 124* 170*  BUN 29* 32* 43*  CREATININE 1.16* 1.18* 1.25*  CALCIUM 8.9 8.9 8.7*  MG  --   --  2.4  PHOS  --   --  3.8   Liver Function Tests: Recent  Labs  Lab 02/26/23 0045 02/27/23 0441  AST 27 23  ALT 24 20  ALKPHOS 122 78  BILITOT 0.5 0.5  PROT 6.4* 5.8*  ALBUMIN 4.0 3.3*   Recent Labs  Lab 02/26/23 0045  LIPASE 37   No results for input(s): "AMMONIA" in the last 168 hours. CBC: Recent Labs  Lab 02/26/23 0045 02/26/23 0455 02/27/23 0441  WBC 8.4 11.5* 6.7  NEUTROABS 6.5  --  6.0  HGB 12.9 11.8* 11.1*  HCT 41.3 38.8 33.8*  MCV 95.4 98.7 93.1  PLT 237 191 203   Cardiac Enzymes: No results for input(s): "CKTOTAL", "CKMB", "CKMBINDEX", "TROPONINI" in the last 168  hours. BNP: Invalid input(s): "POCBNP" CBG: No results for input(s): "GLUCAP" in the last 168 hours. D-Dimer No results for input(s): "DDIMER" in the last 72 hours. Hgb A1c No results for input(s): "HGBA1C" in the last 72 hours. Lipid Profile No results for input(s): "CHOL", "HDL", "LDLCALC", "TRIG", "CHOLHDL", "LDLDIRECT" in the last 72 hours. Thyroid function studies No results for input(s): "TSH", "T4TOTAL", "T3FREE", "THYROIDAB" in the last 72 hours.  Invalid input(s): "FREET3" Anemia work up Recent Labs    02/27/23 0441  VITAMINB12 177*  FOLATE 6.7  FERRITIN 142  TIBC 276  IRON 59  RETICCTPCT 1.6   Urinalysis    Component Value Date/Time   COLORURINE YELLOW (A) 02/26/2023 0455   APPEARANCEUR CLEAR (A) 02/26/2023 0455   LABSPEC 1.026 02/26/2023 0455   PHURINE 6.0 02/26/2023 [place] 02/26/2023 0455   HGBUR NEGATIVE 02/26/2023 [place] NEGATIVE 02/26/2023 0455   KETONESUR NEGATIVE 02/26/2023 0455   PROTEINUR NEGATIVE 02/26/2023 0455   NITRITE NEGATIVE 02/26/2023 0455   LEUKOCYTESUR NEGATIVE 02/26/2023 0455   Sepsis Labs Recent Labs  Lab 02/26/23 0045 02/26/23 0455 02/27/23 0441  WBC 8.4 11.5* 6.7   Microbiology Recent Results (from the past 240 hour(s))  Resp panel by RT-PCR (RSV, Flu A&B, Covid) Anterior Nasal Swab     Status: None   Collection Time: 02/26/23 12:48 AM   Specimen: Anterior Nasal Swab  Result Value Ref Range Status   SARS Coronavirus 2 by RT PCR NEGATIVE NEGATIVE Final    Comment: (NOTE) SARS-CoV-2 target nucleic acids are NOT DETECTED.  The SARS-CoV-2 RNA is generally detectable in upper respiratory specimens during the acute phase of infection. The lowest concentration of SARS-CoV-2 viral copies this assay can detect is 138 copies/mL. A negative result does not preclude SARS-Cov-2 infection and should not be used as the sole basis for treatment or other patient management decisions. A negative result may  occur with  improper specimen collection/handling, submission of specimen other than nasopharyngeal swab, presence of viral mutation(s) within the areas targeted by this assay, and inadequate number of viral copies(<138 copies/mL). A negative result must be combined with clinical observations, patient history, and epidemiological information. The expected result is Negative.  Fact Sheet for Patients:  EntrepreneurPulse.com.au  Fact Sheet for Healthcare Providers:  IncredibleEmployment.be  This test is no t yet approved or cleared by the [place] FDA and  has been authorized for detection and/or diagnosis of SARS-CoV-2 by FDA under an Emergency Use Authorization (EUA). This EUA will remain  in effect (meaning this test can be used) for the duration of the COVID-19 declaration under Section 564(b)(1) of the Act, 21 U.S.C.section 360bbb-3(b)(1), unless the authorization is terminated  or revoked sooner.       Influenza A by PCR NEGATIVE NEGATIVE Final   Influenza B by PCR  NEGATIVE NEGATIVE Final    Comment: (NOTE) The Xpert Xpress SARS-CoV-2/FLU/RSV plus assay is intended as an aid in the diagnosis of influenza from Nasopharyngeal swab specimens and should not be used as a sole basis for treatment. Nasal washings and aspirates are unacceptable for Xpert Xpress SARS-CoV-2/FLU/RSV testing.  Fact Sheet for Patients: EntrepreneurPulse.com.au  Fact Sheet for Healthcare Providers: IncredibleEmployment.be  This test is not yet approved or cleared by the [place] FDA and has been authorized for detection and/or diagnosis of SARS-CoV-2 by FDA under an Emergency Use Authorization (EUA). This EUA will remain in effect (meaning this test can be used) for the duration of the COVID-19 declaration under Section 564(b)(1) of the Act, 21 U.S.C. section 360bbb-3(b)(1), unless the authorization is terminated  or revoked.     Resp Syncytial Virus by PCR NEGATIVE NEGATIVE Final    Comment: (NOTE) Fact Sheet for Patients: EntrepreneurPulse.com.au  Fact Sheet for Healthcare Providers: IncredibleEmployment.be  This test is not yet approved or cleared by the [place] FDA and has been authorized for detection and/or diagnosis of SARS-CoV-2 by FDA under an Emergency Use Authorization (EUA). This EUA will remain in effect (meaning this test can be used) for the duration of the COVID-19 declaration under Section 564(b)(1) of the Act, 21 U.S.C. section 360bbb-3(b)(1), unless the authorization is terminated or revoked.  Performed at [facility], [address] [place], [place] [address]   Culture, blood (Routine X 2) w Reflex to ID Panel     Status: None (Preliminary result)   Collection Time: 02/26/23  4:05 PM   Specimen: BLOOD  Result Value Ref Range Status   Specimen Description BLOOD BLOOD RIGHT HAND  Final   Special Requests   Final    BOTTLES DRAWN AEROBIC AND ANAEROBIC Blood Culture adequate volume   Culture   Final    NO GROWTH < 24 HOURS Performed at [facility], [address] [place], [place] [address]    Report Status PENDING  Incomplete  Culture, blood (Routine X 2) w Reflex to ID Panel     Status: None (Preliminary result)   Collection Time: 02/26/23  4:15 PM   Specimen: BLOOD  Result Value Ref Range Status   Specimen Description BLOOD BLOOD RIGHT FOREARM  Final   Special Requests   Final    BOTTLES DRAWN AEROBIC ONLY Blood Culture adequate volume   Culture   Final    NO GROWTH < 24 HOURS Performed at [facility], [address] [place], [place] [address]    Report Status PENDING  Incomplete  Respiratory (~20 pathogens) panel by PCR     Status: None   Collection Time: 02/26/23  6:20 PM   Specimen: Nasopharyngeal Swab; Respiratory  Result Value Ref Range Status   Adenovirus NOT DETECTED NOT  DETECTED Final   Coronavirus 229E NOT DETECTED NOT DETECTED Final    Comment: (NOTE) The Coronavirus on the Respiratory Panel, DOES NOT test for the novel  Coronavirus (2019 nCoV)    Coronavirus HKU1 NOT DETECTED NOT DETECTED Final   Coronavirus NL63 NOT DETECTED NOT DETECTED Final   Coronavirus OC43 NOT DETECTED NOT DETECTED Final   Metapneumovirus NOT DETECTED NOT DETECTED Final   Rhinovirus / Enterovirus NOT DETECTED NOT DETECTED Final   Influenza A NOT DETECTED NOT DETECTED Final   Influenza B NOT DETECTED NOT DETECTED Final   Parainfluenza Virus 1 NOT DETECTED NOT DETECTED Final   Parainfluenza Virus 2 NOT DETECTED NOT DETECTED Final  Parainfluenza Virus 3 NOT DETECTED NOT DETECTED Final   Parainfluenza Virus 4 NOT DETECTED NOT DETECTED Final   Respiratory Syncytial Virus NOT DETECTED NOT DETECTED Final   Bordetella pertussis NOT DETECTED NOT DETECTED Final   Bordetella Parapertussis NOT DETECTED NOT DETECTED Final   Chlamydophila pneumoniae NOT DETECTED NOT DETECTED Final   Mycoplasma pneumoniae NOT DETECTED NOT DETECTED Final    Comment: Performed at [facility], [place] [address], [place], [place] [address]     Time coordinating discharge: Over 30 minutes  SIGNED:   Sidney Ace, MD  [facility] 02/27/2023, 4:51 PM Pager   If 7PM-7AM, please contact night-coverage

## 2023-02-27 NOTE — Evaluation (Signed)
Physical Therapy Evaluation Patient Details Name: Madison Benson MRN: [id number] DOB: Jun 14, 1941 Today's Date: 02/27/2023  History of Present Illness  Madison Benson is a 82 y.o. female with medical history significant for chronic back pain, hypertension and dyslipidemia, who presented to the emergency room with acute onset of dyspnea over the last couple of days with severe shortness of breath that occurred suddenly when he was headed towards bed this evening when she called EMS.  Clinical Impression  Patient received in bed, just finished breathing tx. Patient is agreeable to PT assessment. She is mod I with supine to sit. Able to stand with min guard. Ambulated with RW 75 feet with min guard. Requires mod A to bring LEs back up onto bed. Patient demonstrates increased RR with ambulation, however O2 sats remained > 90% on room air. She will continue to benefit from skilled PT while here to improve functional mobility and endurance.        Recommendations for follow up therapy are one component of a multi-disciplinary discharge planning process, led by the attending physician.  Recommendations may be updated based on patient status, additional functional criteria and insurance authorization.  Follow Up Recommendations No PT follow up      Assistance Recommended at Discharge Intermittent Supervision/Assistance  Patient can return home with the following  A little help with walking and/or transfers;A little help with bathing/dressing/bathroom;Assist for transportation    Equipment Recommendations None recommended by PT  Recommendations for Other Services       Functional Status Assessment Patient has had a recent decline in their functional status and demonstrates the ability to make significant improvements in function in a reasonable and predictable amount of time.     Precautions / Restrictions Precautions Precautions: Fall Restrictions Weight Bearing Restrictions: No       Mobility  Bed Mobility Overal bed mobility: Needs Assistance Bed Mobility: Supine to Sit, Sit to Supine     Supine to sit: Supervision Sit to supine: Mod assist   General bed mobility comments: requires mod A to bring legs back up onto bed    Transfers Overall transfer level: Needs assistance Equipment used: Rolling walker (2 wheels) Transfers: Sit to/from Stand Sit to Stand: Supervision, Min guard                Ambulation/Gait Ambulation/Gait assistance: Min guard Gait Distance (Feet): 75 Feet Assistive device: Rolling walker (2 wheels) Gait Pattern/deviations: Step-through pattern, Decreased step length - right, Decreased step length - left, Decreased stride length Gait velocity: decreased     General Gait Details: chronic abnormalities of R LE, patient with increased RR with ambulation up to 30s. HR at 90 and O2 sats in 90%s.  Stairs            Wheelchair Mobility    Modified Rankin (Stroke Patients Only)       Balance Overall balance assessment: Modified Independent                                           Pertinent Vitals/Pain Pain Assessment Pain Assessment: Faces Faces Pain Scale: Hurts little more Pain Location: chronic pain, back Pain Descriptors / Indicators: Discomfort Pain Intervention(s): Monitored during session    Home Living Family/patient expects to be discharged to:: Private residence Living Arrangements: Spouse/significant other Available Help at Discharge: Family;Available 24 hours/day Type of Home: Apartment Home Access: Level entry  Home Layout: One level Home Equipment: Rollator (4 wheels)      Prior Function Prior Level of Function : Independent/Modified Independent             Mobility Comments: uses rollator for short distances and scooter for long distances ADLs Comments: mod I     Hand Dominance        Extremity/Trunk Assessment   Upper Extremity Assessment Upper  Extremity Assessment: Defer to OT evaluation    Lower Extremity Assessment Lower Extremity Assessment: Overall WFL for tasks assessed    Cervical / Trunk Assessment Cervical / Trunk Assessment: Normal  Communication   Communication: No difficulties  Cognition Arousal/Alertness: Awake/alert Behavior During Therapy: WFL for tasks assessed/performed Overall Cognitive Status: Within Functional Limits for tasks assessed                                          General Comments      Exercises     Assessment/Plan    PT Assessment Patient needs continued PT services  PT Problem List Decreased strength;Decreased balance;Decreased mobility;Cardiopulmonary status limiting activity       PT Treatment Interventions Gait training;Functional mobility training;Therapeutic activities;Patient/family education    PT Goals (Current goals can be found in the Care Plan section)  Acute Rehab PT Goals Patient Stated Goal: to return home PT Goal Formulation: With patient Time For Goal Achievement: 03/06/23 Potential to Achieve Goals: Good    Frequency Min 2X/week     Co-evaluation               AM-PAC PT "6 Clicks" Mobility  Outcome Measure Help needed turning from your back to your side while in a flat bed without using bedrails?: A Little Help needed moving from lying on your back to sitting on the side of a flat bed without using bedrails?: A Little Help needed moving to and from a bed to a chair (including a wheelchair)?: A Little Help needed standing up from a chair using your arms (e.g., wheelchair or bedside chair)?: A Little Help needed to walk in hospital room?: A Little Help needed climbing 3-5 steps with a railing? : A Lot 6 Click Score: 17    End of Session   Activity Tolerance: Patient tolerated treatment well Patient left: in bed;with call bell/phone within reach Nurse Communication: Mobility status PT Visit Diagnosis: Muscle weakness  (generalized) (M62.81);Difficulty in walking, not elsewhere classified (R26.2);Other abnormalities of gait and mobility (R26.89)    Time: [id number] PT Time Calculation (min) (ACUTE ONLY): 15 min   Charges:   PT Evaluation $PT Eval Moderate Complexity: 1 Mod          Jabrea Kallstrom, PT, GCS 02/27/23,10:50 AM

## 2023-02-27 NOTE — TOC Transition Note (Signed)
Transition of Care [facility]) - CM/SW Discharge Note   Patient Details  Name: Madison Benson MRN: [id number] Date of Birth: Apr 28, 1941  [facility] CM/SW Contact:  Tiburcio Bash, LCSW Phone Number: 02/27/2023, 12:41 PM   Clinical Narrative:     Patient is from [facility] at [place], patient agreeable to home health services with [place]. Orders are in for [facility] PT OT and RN. No additional discharge needs identified.    Final next level of care: Home w Home Health Services Barriers to Discharge: No Barriers Identified   Patient Goals and CMS Choice CMS Medicare.gov Compare Post Acute Care list provided to:: Patient Choice offered to / list presented to : Patient  Discharge Placement                         Discharge Plan and Services Additional resources added to the After Visit Summary for                                       Social Determinants of Health (SDOH) Interventions SDOH Screenings   Food Insecurity: No Food Insecurity (02/26/2023)  Housing: Low Risk  (02/26/2023)  Transportation Needs: No Transportation Needs (02/26/2023)  Utilities: Not At Risk (02/26/2023)  Tobacco Use: Low Risk  (02/26/2023)     Readmission Risk Interventions     No data to display

## 2023-02-27 NOTE — Evaluation (Signed)
Occupational Therapy Evaluation Patient Details Name: Madison Benson MRN: [id number] DOB: 1941/01/28 Today's Date: 02/27/2023   History of Present Illness Madison Benson is a 82 y.o. female with medical history significant for chronic back pain, hypertension and dyslipidemia, who presented to the emergency room with acute onset of dyspnea over the last couple of days with severe shortness of breath that occurred suddenly when he was headed towards bed this evening when she called EMS.   Clinical Impression   Patient presenting with decreased Ind in self care,balance, functional mobility/transfers, endurance, and safety awareness. Patient reports being Mod I at baseline with use of RW and lives with husband. She endorses being Ind with self care and husband assists with IADLs. Pt could benefit from energy conservation education for self care and IADLs.  Patient currently functioning at close supervision - min guard for functional mobility and toileting needs this session. Pt ambulates on RA with O2 at or above 90%. Pt needing cuing for breathing techniques. Patient will benefit from acute OT to increase overall independence in the areas of ADLs, functional mobility, and safety awareness in order to safely discharge home with family.      Recommendations for follow up therapy are one component of a multi-disciplinary discharge planning process, led by the attending physician.  Recommendations may be updated based on patient status, additional functional criteria and insurance authorization.   Follow Up Recommendations  Home health OT     Assistance Recommended at Discharge Intermittent Supervision/Assistance  Patient can return home with the following A little help with walking and/or transfers;A little help with bathing/dressing/bathroom;Help with stairs or ramp for entrance;Assistance with cooking/housework;Assist for transportation    Functional Status Assessment  Patient has had a  recent decline in their functional status and demonstrates the ability to make significant improvements in function in a reasonable and predictable amount of time.  Equipment Recommendations  None recommended by OT       Precautions / Restrictions Precautions Precautions: Fall Restrictions Weight Bearing Restrictions: No      Mobility Bed Mobility Overal bed mobility: Needs Assistance Bed Mobility: Supine to Sit, Sit to Supine     Supine to sit: Supervision Sit to supine: Mod assist   General bed mobility comments: requires mod A to bring legs back up onto bed    Transfers Overall transfer level: Needs assistance Equipment used: Rolling walker (2 wheels) Transfers: Sit to/from Stand Sit to Stand: Supervision                  Balance Overall balance assessment: Modified Independent                                         ADL either performed or assessed with clinical judgement   ADL Overall ADL's : Needs assistance/impaired                         Toilet Transfer: Supervision/safety;Regular Toilet;Rolling walker (2 wheels)   Toileting- Clothing Manipulation and Hygiene: Supervision/safety;Sit to/from stand       Functional mobility during ADLs: Supervision/safety       Vision Patient Visual Report: No change from baseline              Pertinent Vitals/Pain Pain Assessment Pain Assessment: No/denies pain Faces Pain Scale: Hurts little more Pain Location: chronic pain, back Pain Descriptors / Indicators: Discomfort  Pain Intervention(s): Monitored during session     Hand Dominance Right   Extremity/Trunk Assessment Upper Extremity Assessment Upper Extremity Assessment: RUE deficits/detail;LUE deficits/detail;Generalized weakness RUE Deficits / Details: limited AROM for shoulder elevation ~ 90 degrees at baseline LUE Deficits / Details: limited AROM for shoulder elevation ~ 90 degrees at baseline   Lower Extremity  Assessment Lower Extremity Assessment: Overall WFL for tasks assessed   Cervical / Trunk Assessment Cervical / Trunk Assessment: Normal   Communication Communication Communication: No difficulties   Cognition Arousal/Alertness: Awake/alert Behavior During Therapy: WFL for tasks assessed/performed Overall Cognitive Status: Within Functional Limits for tasks assessed                                                  Home Living Family/patient expects to be discharged to:: Private residence Living Arrangements: Spouse/significant other Available Help at Discharge: Family;Available 24 hours/day Type of Home: Apartment Home Access: Level entry     Home Layout: One level     Bathroom Shower/Tub: Walk-in shower         Home Equipment: Rollator (4 wheels)          Prior Functioning/Environment Prior Level of Function : Independent/Modified Independent             Mobility Comments: uses rollator for short distances and scooter for long distances ADLs Comments: mod I        OT Problem List: Decreased strength;Decreased range of motion;Decreased activity tolerance;Decreased safety awareness;Impaired balance (sitting and/or standing);Decreased knowledge of use of DME or AE      OT Treatment/Interventions: Self-care/ADL training;Therapeutic exercise;Therapeutic activities;Energy conservation;DME and/or AE instruction;Patient/family education;Balance training    OT Goals(Current goals can be found in the care plan section) Acute Rehab OT Goals Patient Stated Goal: to feel better and go home OT Goal Formulation: With patient Time For Goal Achievement: 03/13/23 Potential to Achieve Goals: Fair ADL Goals Pt Will Perform Grooming: with modified independence;standing Pt Will Perform Lower Body Dressing: with modified independence;sit to/from stand Pt Will Transfer to Toilet: with modified independence;ambulating Pt Will Perform Toileting - Clothing  Manipulation and hygiene: with modified independence;sit to/from stand  OT Frequency: Min 2X/week       AM-PAC OT "6 Clicks" Daily Activity     Outcome Measure Help from another person eating meals?: None Help from another person taking care of personal grooming?: None Help from another person toileting, which includes using toliet, bedpan, or urinal?: None Help from another person bathing (including washing, rinsing, drying)?: None Help from another person to put on and taking off regular upper body clothing?: None Help from another person to put on and taking off regular lower body clothing?: None 6 Click Score: 24   End of Session Equipment Utilized During Treatment: Rolling walker (2 wheels) Nurse Communication: Mobility status;Other (comment)  Activity Tolerance: Patient tolerated treatment well Patient left: in bed;with call bell/phone within reach  OT Visit Diagnosis: Unsteadiness on feet (R26.81);Repeated falls (R29.6);Muscle weakness (generalized) (M62.81)                Time: [id number] OT Time Calculation (min): 23 min Charges:  OT General Charges $OT Visit: 1 Visit OT Evaluation $OT Eval Low Complexity: 1 Low OT Treatments $Self Care/Home Management : 8-22 mins  Darleen Crocker, MS, OTR/L , CBIS ascom [phone]  02/27/23, 11:26 AM

## 2023-02-27 NOTE — Care Management Obs Status (Signed)
[facility] NOTIFICATION   Patient Details  Name: Madison Benson MRN: [id number] Date of Birth: 26-Jul-1941   Medicare Observation Status Notification Given:  Yes    Cecil Cobbs 02/27/2023, 12:25 PM

## 2023-03-03 LAB — CULTURE, BLOOD (ROUTINE X 2)
Culture: NO GROWTH
Culture: NO GROWTH
Special Requests: ADEQUATE
Special Requests: ADEQUATE

## 2023-03-06 ENCOUNTER — Ambulatory Visit (INDEPENDENT_AMBULATORY_CARE_PROVIDER_SITE_OTHER): Payer: Medicare Other | Admitting: Student in an Organized Health Care Education/Training Program

## 2023-03-06 ENCOUNTER — Encounter: Payer: Self-pay | Admitting: Student in an Organized Health Care Education/Training Program

## 2023-03-06 VITALS — BP 120/70 | HR 65 | Temp 97.8°F | Ht 60.0 in | Wt 170.0 lb

## 2023-03-06 DIAGNOSIS — Z720 Tobacco use: Secondary | ICD-10-CM

## 2023-03-06 DIAGNOSIS — R0602 Shortness of breath: Secondary | ICD-10-CM | POA: Diagnosis not present

## 2023-03-06 MED ORDER — NICOTINE 7 MG/24HR TD PT24: 7.0000 mg | MEDICATED_PATCH | TRANSDERMAL | 0 refills | Status: AC

## 2023-03-06 MED ORDER — NICOTINE POLACRILEX 2 MG MT LOZG: 2.0000 mg | LOZENGE | OROMUCOSAL | 3 refills | Status: AC | PRN

## 2023-03-06 MED ORDER — NICOTINE 21 MG/24HR TD PT24: 21.0000 mg | MEDICATED_PATCH | TRANSDERMAL | 0 refills | Status: DC

## 2023-03-06 MED ORDER — NICOTINE 14 MG/24HR TD PT24: 14.0000 mg | MEDICATED_PATCH | TRANSDERMAL | 0 refills | Status: AC

## 2023-03-06 NOTE — Patient Instructions (Signed)
The [facility] Quitline: Call 1-800-QUIT-NOW 575-707-4353). The [place] Quitline is a free service for [employer]. Trained counselors are available from 8 am until 3 am, 365 days per year. Services are available in both [place] and [place].   Web Resources Free online support programs can help you track your progress and share experiences with others who are quitting. These are examples: www.becomeanex.org www.trytostop.org  www.smokefree.gov  www.[employer].com.br.aspx  [facility]: offers comprehensive in-person tobacco treatment counseling at [facility] ([address], [place] [address]).  Open to everyone. Virtual appointments available. Free parking. Call [phone] to schedule an appointment or [phone] for general information.    Tobacco Cessation Medications  Nicotine Replacement Therapy (NRT)  Nicotine is the addictive part of tobacco smoke, but not the most dangerous part. There are 7000 other toxins in cigarettes, including carbon monoxide, that cause disease. People do not generally become addicted to medication. Common problems: People don't use enough medication or stop too early. Medications are safe and effective. Overdose is very uncommon. Use medications as long as needed (3 months minimum). Some combinations work better than single medications. Long acting medications like the NRT patch and bupropion provide continuous treatment for withdrawal symptoms.  PLUS  Short acting medications like the NRT gum, lozenge, inhaler, and nasal spray help people to cope with breakthrough cravings.  ? Nicotine Patch  Place patch on hairless skin on upper body, including arms and back. Each day: discard old patch, shower, apply new patch to a different site. Apply hydrocortisone cream to mildly red/irritated areas. Call provider if rash develops. If patch causes sleep disturbance, remove patch  at bedtime and replace each morning after shower. Side effects may include: skin irritation, headache, insomnia, abnormal/vivid dreams.  ? Nicotine Gum  Chew gum slowly, park in cheek when peppery taste or tingling sensation begins (about 15-30 chews). When taste or tingling goes away, begin chewing again. Use until nicotine is gone (taste or tingle does not return, usually 30 minutes). Park in different areas of mouth. Nicotine is absorbed through the lining of the mouth. Use enough to control cravings, up to 24 pieces per day (if used alone). Avoid eating or drinking for 15 minutes before using and during use. Side effects may include: mouth/jaw soreness, hiccups, indigestion, hypersalivation.  If gum is not chewed correctly, additional side effects may include lightheadedness, nausea/vomiting, throat and mouth irritation.  ? Nicotine Lozenge  Allow to dissolve slowly in mouth (20-30 minutes). Do not chew or swallow. Nicotine release may cause a warm tingling sensation. Occasionally rotate to different areas of the mouth. Use enough to control cravings, up to 20 lozenges per day (if used alone). Avoid eating or drinking for 15 minutes before using and during use. Side effects may include: nausea, hiccups, cough, heartburn, headache, gas, insomnia.  ? Nicotine Nasal Spray Use 1 spray in each nostril (1 dose) and tilt head back for 1 minute. Do not sniff, swallow, or inhale through nose.  Use at least 8 doses (1 spray in each nostril) , up to 40 doses per day (if used alone). To reduce nasal irritation, spray on cotton swab and insert into nose. Side effects may include: nasal and/or throat irritation (hot, peppery, or burning sensation), nasal irritation, tearing, sneezing, cough, headache.  ? Nicotine Oral Inhaler (puffer) Inhale into the back of the throat or puff in short breaths. Do not inhale into the lungs.  Puff continuously for 20 minutes (about 80 puffs) until cartridge  is  empty. Change cartridge when it loses the "burning in throat" sensation (feels like air only). Open cartridges can be saved and used again within 24 hours. Use at least 6 and up to 16 cartridges per day (if used alone).  Avoid eating or drinking for 15 minutes before using and during use. Side effects may include: mouth and/or throat irritation, unpleasant taste, cough, nasal irritation, indigestion, hiccups, headache.  ? Chantix (varenicline) Days 1-3: Take one 0.5 mg white pill each morning for 3 days, one week before quit date. Days 4-7: Increase to one 0.5 mg white pill twice a day in morning and evening for 4 days.  On Day 8 (target quit date), increase to one 1 mg blue pill twice a day. Maintain this dose for a minimum of 3 months. Take with food and a full glass of water to reduce nausea. Be sure that the two doses are at least 8 hours apart, but try to take second dose early in the evening (i.e. 6 pm) to avoid sleep problems. Common side effects include: nausea, insomnia, headache, abnormal/vivid dreams. Tell your doctor if you have any history of psychiatric illness prior to starting Chantix.  STOP taking CHANTIX and contact a healthcare provider immediately if you experience agitation, hostility, depressed mood, changes in thoughts or behavior that are not typical for you, thinking about or attempting suicide, allergic or skin reactions including swelling, rash, redness, or peeling of the skin.  For patients who have heart disease: Smoking is a major risk factor for cardiovascular disease, and Chantix can help you quit smoking. Chantix may be associated with a small, increased risk of certain heart events in patients who have heart disease. If you have any new or worsening symptoms of heart disease while taking Chantix, such as shortness of breath or trouble breathing, new or worsening chest pain, or new or worsening pain in your legs when walking, call your doctor or get emergency medical  help immediately.  ? Wellbutrin / Zyban (bupropion) Take one 150 mg pill each morning for 3 days, one week before target quit date. On Day 4, increase to one 150 mg pill twice a day, morning and evening.  Maintain this dose for a minimum of 3 months. Be sure that the two doses are at least 8 hours apart, but try to take second dose early in the evening (i.e. 6 pm) to avoid sleep problems. Avoid or minimize use of alcohol when taking this medication. Common side effects include: dry mouth, headache, insomnia, nausea, weight loss.  Risk of seizure is 12/998. STOP taking BUPROPION and contact a healthcare provider immediately if you experience agitation, hostility, depressed mood, changes in thoughts or behavior that are not typical for you, thinking about or attempting suicide, allergic or skin reactions including swelling, rash, redness, or peeling of the skin.

## 2023-03-06 NOTE — Progress Notes (Addendum)
Synopsis: Referred in for shortness of breath by Sidney Ace, MD  Assessment & Plan:   1. Shortness of breath  Her shortness of breath is in the setting of smoking history as well as diastolic dysfunction noted on TTE (with an elevated BNP).  On exam, her lungs are clear with faint bibasilar Rales and no wheezing.  She has notable changes of stasis dermatitis in the lower extremities with +1 pitting edema.  My impression is that she likely has a combination of heart failure with preserved ejection fraction as well as possible COPD.  She is also likely restricted secondary to her spinal surgeries and kyphosis.  I will obtain a pulmonary function test to assess her spirometry for any reversible obstruction as well as evaluate her lung volumes.  Should spirometry suggest COPD or reactive airway disease, I will consider a long-acting inhaler.  I also believe she would benefit from a referral to cardiology for management of her HFpEF as well as her hypertension.  Furthermore, the CT scan of the chest, while showing normal lung parenchyma, does note lower lobe bronchiectasis.  I suspect this is secondary to chronically retained secretions in the setting of her kyphosis and immobility. The CT scan also has a calcified granuloma in the LLL with associated calcified lymph nodes. There are also calcifications in the Spleen. 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 I will prescribe her a flutter device as well as an incentive spirometer to help her mobilize her secretions.  - Pulmonary Function Test [facility] Only; Future - Ambulatory referral to Cardiology - AMB REFERRAL FOR DME  2. Tobacco abuse  Interested in smoking cessation, will provide with a prescription for nicotine patches and lozenges.  - nicotine (NICODERM CQ - DOSED IN MG/24 HOURS) 21 mg/24hr patch; Place 1 patch (21 mg total) onto the skin daily.  Dispense: 42 patch; Refill: 0 - nicotine (NICODERM CQ -  DOSED IN MG/24 HOURS) 14 mg/24hr patch; Place 1 patch (14 mg total) onto the skin daily for 14 days.  Dispense: 14 patch; Refill: 0 - nicotine (NICODERM CQ - DOSED IN MG/24 HR) 7 mg/24hr patch; Place 1 patch (7 mg total) onto the skin daily for 14 days.  Dispense: 14 patch; Refill: 0 - nicotine polacrilex (NICOTINE MINI) 2 MG lozenge; Take 1 lozenge (2 mg total) by mouth every 2 (two) hours as needed for smoking cessation.  Dispense: 72 lozenge; Refill: 3   Return in about 2 months (around 05/06/2023).  I spent 60 minutes caring for this patient today, including preparing to see the patient, obtaining a medical history , reviewing a separately obtained history, performing a medically appropriate examination and/or evaluation, counseling and educating the patient/family/caregiver, ordering medications, tests, or procedures, documenting clinical information in the electronic health record, and independently interpreting results (not separately reported/billed) and communicating results to the patient/family/caregiver  Armando Reichert, MD [facility] 03/06/2023 3:23 PM    End of visit medications:  Meds ordered this encounter  Medications   nicotine (NICODERM CQ - DOSED IN MG/24 HOURS) 21 mg/24hr patch    Sig: Place 1 patch (21 mg total) onto the skin daily.    Dispense:  42 patch    Refill:  0   nicotine (NICODERM CQ - DOSED IN MG/24 HOURS) 14 mg/24hr patch    Sig: Place 1 patch (14 mg total) onto the skin daily for 14 days.    Dispense:  14 patch    Refill:  0   nicotine (NICODERM CQ - DOSED IN MG/24 HR) 7 mg/24hr patch    Sig: Place 1 patch (7 mg total) onto the skin daily for 14 days.    Dispense:  14 patch    Refill:  0   nicotine polacrilex (NICOTINE MINI) 2 MG lozenge    Sig: Take 1 lozenge (2 mg total) by mouth every 2 (two) hours as needed for smoking cessation.    Dispense:  72 lozenge    Refill:  3     Current Outpatient Medications:    albuterol (VENTOLIN  HFA) 108 (90 Base) MCG/ACT inhaler, Inhale 1-2 puffs into the lungs every 4 (four) hours as needed., Disp: 8 g, Rfl: 0   amLODipine (NORVASC) 2.5 MG tablet, Take 5 mg by mouth at bedtime., Disp: , Rfl:    aspirin 81 MG EC tablet, Take 81 mg by mouth daily., Disp: , Rfl:    Calcium-Vitamin D-Vitamin K [id number] MG-UNT-MCG TABS, Take 1 tablet by mouth daily., Disp: , Rfl:    cholecalciferol (VITAMIN D) 1000 units tablet, Take 1,000 Units by mouth 2 (two) times daily., Disp: , Rfl:    DULoxetine (CYMBALTA) 60 MG capsule, Take 60 mg by mouth at bedtime., Disp: , Rfl:    fentaNYL (DURAGESIC - DOSED MCG/HR) 50 MCG/HR, Place 12 mcg onto the skin every 3 (three) days., Disp: , Rfl:    Ferrous Sulfate Dried 45 MG TBCR, Take 45 mg by mouth daily., Disp: , Rfl:    lactobacillus acidophilus (BACID) TABS tablet, Take 1 tablet by mouth daily., Disp: , Rfl:    losartan (COZAAR) 100 MG tablet, Take 50 mg by mouth daily., Disp: , Rfl:    Magnesium 100 MG TABS, Take 100 mg by mouth daily., Disp: , Rfl:    Multiple Vitamins-Minerals (PRESERVISION AREDS 2 PO), Take 1 tablet by mouth daily., Disp: , Rfl:    nicotine (NICODERM CQ - DOSED IN MG/24 HOURS) 14 mg/24hr patch, Place 1 patch (14 mg total) onto the skin daily for 14 days., Disp: 14 patch, Rfl: 0   nicotine (NICODERM CQ - DOSED IN MG/24 HOURS) 21 mg/24hr patch, Place 1 patch (21 mg total) onto the skin daily., Disp: 42 patch, Rfl: 0   nicotine (NICODERM CQ - DOSED IN MG/24 HR) 7 mg/24hr patch, Place 1 patch (7 mg total) onto the skin daily for 14 days., Disp: 14 patch, Rfl: 0   nicotine polacrilex (NICOTINE MINI) 2 MG lozenge, Take 1 lozenge (2 mg total) by mouth every 2 (two) hours as needed for smoking cessation., Disp: 72 lozenge, Rfl: 3   nitrofurantoin, macrocrystal-monohydrate, (MACROBID) 100 MG capsule, Take 100 mg by mouth 2 (two) times daily., Disp: , Rfl:    omeprazole (PRILOSEC) 20 MG capsule, Take 20 mg by mouth daily., Disp: , Rfl:    Oxycodone HCl  10 MG TABS, Take 10 mg by mouth 3 (three) times daily., Disp: , Rfl:    polyethylene glycol (MIRALAX / GLYCOLAX) 17 g packet, Take 17 g by mouth daily., Disp: , Rfl:    simvastatin (ZOCOR) 20 MG tablet, Take 20 mg by mouth at bedtime., Disp: , Rfl:    solifenacin (VESICARE) 5 MG tablet, Take 5 mg by mouth daily., Disp: , Rfl:    tiZANidine (ZANAFLEX) 4 MG tablet, Take 4 mg by mouth 2 (two) times daily., Disp: , Rfl:    glucose blood test strip, 1 each by Other route as needed for other. Use as instructed (Patient not taking:  Reported on 03/06/2023), Disp: , Rfl:    Subjective:   PATIENT ID: Madison Benson GENDER: female DOB: [birth year], MRN: [id number]  Chief Complaint  Patient presents with   pulmonary consut    Recent admission- SOB with exertion and occ dry cough.     HPI  Patient is an 82 year old female presenting to clinic for the evaluation of shortness of breath.  Patient was recently admitted to the hospital on 02/26/2023 for 36 hours for the workup and management of acute hypoxic respiratory failure.  The patient presented to the ER because of increased shortness of breath as well as hypoxia, and was admitted and managed for a combination of COPD exacerbation as well as decompensated heart failure.  She was diuresed, given prednisone, nebulizers, and antibiotics with notable improvement.  Patient reports that she has been short of breath for a long time now.  She is short of breath with any exertion and is very limited in her mobility and level of activity secondary to multiple musculoskeletal complaints.  She has had multiple back and joint surgeries and currently mostly moves around with her scooter.  She is unable to take a deep breath but does not feel short of breath at rest.  She does not have a cough, does not produce sputum, and does not endorse any chest pain or chest tightness.  She does endorse some wheezing previously on visits to her primary care physician (5 years ago)  and to the ER (2 weeks ago).  She had not been on inhalers in the past but was started on albuterol this go around.  Her other medical problems include hypertension for which she is medically managed and does endorse labile blood pressure.  Patient used to work as a Pharmacist, community.  She is a smoker and has done so for most of her life.  She currently smokes half a pack a day and has between 30 and 40 pack years of smoking history.  She would like to quit.  No other exposures reported.  ancillary information including prior medications, full medical/surgical/family/social histories, and PFTs (when available) are listed below and have been reviewed.   Review of Systems  Constitutional:  Negative for chills, fever, malaise/fatigue and weight loss.  Respiratory:  Positive for shortness of breath. Negative for cough, hemoptysis, sputum production and wheezing.   Cardiovascular:  Positive for leg swelling. Negative for chest pain.     Objective:   Vitals:   03/06/23 1443  BP: 120/70  Pulse: 65  Temp: 97.8 F (36.6 C)  TempSrc: Temporal  SpO2: 97%  Weight: 170 lb (77.1 kg)  Height: 5' (1.524 m)   97% on RA  BMI Readings from Last 3 Encounters:  03/06/23 33.20 kg/m  02/26/23 33.20 kg/m  10/29/22 34.02 kg/m   Wt Readings from Last 3 Encounters:  03/06/23 170 lb (77.1 kg)  02/26/23 170 lb (77.1 kg)  10/29/22 174 lb 3.2 oz (79 kg)    Physical Exam Constitutional:      General: She is not in acute distress.    Appearance: She is obese. She is not ill-appearing.  HENT:     Mouth/Throat:     Mouth: Mucous membranes are moist.  Neck:     Comments: kyphosis Cardiovascular:     Rate and Rhythm: Normal rate and regular rhythm.     Pulses: Normal pulses.     Heart sounds: Normal heart sounds.  Pulmonary:     Breath sounds: Normal breath sounds. No  wheezing, rhonchi or rales.  Abdominal:     Palpations: Abdomen is soft.  Musculoskeletal:     Right lower leg:  Edema present.     Left lower leg: Edema present.  Neurological:     Mental Status: She is alert.     Ancillary Information    Past Medical History:  Diagnosis Date   Chronic back pain    HLD (hyperlipidemia)    Hypertension      Family History  Problem Relation Age of Onset   Hypertension Other      Past Surgical History:  Procedure Laterality Date   BACK SURGERY      Social History   Socioeconomic History   Marital status: Married    Spouse name: Not on file   Number of children: Not on file   Years of education: Not on file   Highest education level: Not on file  Occupational History   Not on file  Tobacco Use   Smoking status: Every Day    Packs/day: 0.50    Years: 64.00    Total pack years: 32.00    Types: Cigarettes   Smokeless tobacco: Never  Vaping Use   Vaping Use: Never used  Substance and Sexual Activity   Alcohol use: Yes    Alcohol/week: 5.0 standard drinks of alcohol    Types: 5 Glasses of wine per week   Drug use: No   Sexual activity: Not on file  Other Topics Concern   Not on file  Social History Narrative   Not on file   Social Determinants of Health   Financial Resource Strain: Not on file  Food Insecurity: No Food Insecurity (02/26/2023)   Hunger Vital Sign    Worried About Running Out of Food in the Last Year: Never true    Ran Out of Food in the Last Year: Never true  Transportation Needs: No Transportation Needs (02/26/2023)   PRAPARE - Hydrologist (Medical): No    Lack of Transportation (Non-Medical): No  Physical Activity: Not on file  Stress: Not on file  Social Connections: Not on file  Intimate Partner Violence: Not At Risk (02/26/2023)   Humiliation, Afraid, Rape, and Kick questionnaire    Fear of Current or Ex-Partner: No    Emotionally Abused: No    Physically Abused: No    Sexually Abused: No     Allergies  Allergen Reactions   Latex Rash    After band aid left for couple  days After band aid left for couple days After band aid left for couple days    Nsaids Diarrhea and Nausea And Vomiting   Sulfa Antibiotics Diarrhea and Nausea And Vomiting   Ciprofloxacin Itching   Diclofenac Sodium Diarrhea   Naproxen Diarrhea    Any anti-inflammatory causes severe diarrhea Any anti-inflammatory causes severe diarrhea Any anti-inflammatory causes severe diarrhea      CBC    Component Value Date/Time   WBC 6.7 02/27/2023 0441   RBC 3.63 (L) 02/27/2023 0441   RBC 3.63 (L) 02/27/2023 0441   HGB 11.1 (L) 02/27/2023 0441   HCT 33.8 (L) 02/27/2023 0441   PLT 203 02/27/2023 0441   MCV 93.1 02/27/2023 0441   MCH 30.6 02/27/2023 0441   MCHC 32.8 02/27/2023 0441   RDW 13.4 02/27/2023 0441   LYMPHSABS 0.5 (L) 02/27/2023 0441   MONOABS 0.1 02/27/2023 0441   EOSABS 0.0 02/27/2023 0441   BASOSABS 0.0 02/27/2023 0441  Pulmonary Functions Testing Results:     No data to display          Outpatient Medications Prior to Visit  Medication Sig Dispense Refill   albuterol (VENTOLIN HFA) 108 (90 Base) MCG/ACT inhaler Inhale 1-2 puffs into the lungs every 4 (four) hours as needed. 8 g 0   amLODipine (NORVASC) 2.5 MG tablet Take 5 mg by mouth at bedtime.     aspirin 81 MG EC tablet Take 81 mg by mouth daily.     Calcium-Vitamin D-Vitamin K [id number] MG-UNT-MCG TABS Take 1 tablet by mouth daily.     cholecalciferol (VITAMIN D) 1000 units tablet Take 1,000 Units by mouth 2 (two) times daily.     DULoxetine (CYMBALTA) 60 MG capsule Take 60 mg by mouth at bedtime.     fentaNYL (DURAGESIC - DOSED MCG/HR) 50 MCG/HR Place 12 mcg onto the skin every 3 (three) days.     Ferrous Sulfate Dried 45 MG TBCR Take 45 mg by mouth daily.     lactobacillus acidophilus (BACID) TABS tablet Take 1 tablet by mouth daily.     losartan (COZAAR) 100 MG tablet Take 50 mg by mouth daily.     Magnesium 100 MG TABS Take 100 mg by mouth daily.     Multiple Vitamins-Minerals (PRESERVISION AREDS  2 PO) Take 1 tablet by mouth daily.     nitrofurantoin, macrocrystal-monohydrate, (MACROBID) 100 MG capsule Take 100 mg by mouth 2 (two) times daily.     omeprazole (PRILOSEC) 20 MG capsule Take 20 mg by mouth daily.     Oxycodone HCl 10 MG TABS Take 10 mg by mouth 3 (three) times daily.     polyethylene glycol (MIRALAX / GLYCOLAX) 17 g packet Take 17 g by mouth daily.     simvastatin (ZOCOR) 20 MG tablet Take 20 mg by mouth at bedtime.     solifenacin (VESICARE) 5 MG tablet Take 5 mg by mouth daily.     tiZANidine (ZANAFLEX) 4 MG tablet Take 4 mg by mouth 2 (two) times daily.     glucose blood test strip 1 each by Other route as needed for other. Use as instructed (Patient not taking: Reported on 03/06/2023)     No facility-administered medications prior to visit.

## 2023-03-07 ENCOUNTER — Telehealth: Payer: Self-pay | Admitting: Student in an Organized Health Care Education/Training Program

## 2023-03-07 NOTE — Telephone Encounter (Signed)
[employer] calling w questions about order. Pls advise

## 2023-03-07 NOTE — Telephone Encounter (Signed)
I forwarded the order to [facility] with [employer] asking her to check and see what is needed

## 2023-03-11 NOTE — Telephone Encounter (Signed)
Melissa from [place] called me back and  someone with [employer] was trying to confirm which flutter device we wanted for the patient. Melissa told them we only order the [employer]. The device is being sent to the patient

## 2023-03-19 ENCOUNTER — Other Ambulatory Visit: Payer: Self-pay | Admitting: Family Medicine

## 2023-03-19 DIAGNOSIS — Z1231 Encounter for screening mammogram for malignant neoplasm of breast: Secondary | ICD-10-CM

## 2023-04-09 ENCOUNTER — Ambulatory Visit: Payer: Medicare Other | Admitting: Internal Medicine

## 2023-04-10 ENCOUNTER — Ambulatory Visit
Admission: RE | Admit: 2023-04-10 | Discharge: 2023-04-10 | Disposition: A | Discharge status: Home / Self Care | Payer: Medicare Other | Source: Ambulatory Visit | Attending: Family Medicine | Admitting: Family Medicine

## 2023-04-10 DIAGNOSIS — Z1231 Encounter for screening mammogram for malignant neoplasm of breast: Secondary | ICD-10-CM | POA: Insufficient documentation

## 2023-04-15 ENCOUNTER — Ambulatory Visit: Payer: Medicare Other | Attending: Internal Medicine | Admitting: Internal Medicine

## 2023-04-15 ENCOUNTER — Encounter: Payer: Self-pay | Admitting: Internal Medicine

## 2023-04-15 VITALS — BP 112/76 | HR 64 | Ht 60.0 in | Wt 178.8 lb

## 2023-04-15 DIAGNOSIS — E785 Hyperlipidemia, unspecified: Secondary | ICD-10-CM | POA: Diagnosis present

## 2023-04-15 DIAGNOSIS — R0602 Shortness of breath: Secondary | ICD-10-CM | POA: Diagnosis present

## 2023-04-15 MED ORDER — ROSUVASTATIN CALCIUM 20 MG PO TABS: 20.0000 mg | ORAL_TABLET | Freq: Every day | ORAL | 3 refills | Status: DC

## 2023-04-15 NOTE — Progress Notes (Signed)
Cardiology Office Note:    Date:  04/15/2023   ID:  Madison Benson, DOB 30-Apr-1941, MRN [id number]  PCP:  Jerl Mina, MD   [facility] Providers Cardiologist:  Maisie Fus, MD     Referring MD: Jerl Mina, MD   No chief complaint on file. SOB  History of Present Illness:    Madison Benson is a 82 y.o. female with a hx of HLD, HTN, active smoker since she was younger, referral from pulmonology for c/f ?HFpEF  He was admitted 02/26/2023 and discharged the next day. She presented with dypsnea, that was acute when he was walking towards his bed. She called EMS. Her O2 sat was 88% and she was working to breath. She was placed on 6L O2. She had an echo that showed normal EF. She is an active smoker. There was c/f COPD exacerbation. Her BNP was mild 131. Cxray did not show significant edema. CT showed severe CAC. CT also showed severe bronchiectasis. She was recommended to FU with pulmonology. She is pending PFTs. Dr. Aundria Rud speculated she could have HFpEF.   Today, she feels good. She comes in using a wheel chair. She is deconditioned. Her weight has been stable in the last couple of months. S She has lymphadema with compression stockings. No PND or orthopnea.  No heart failure hospitalizations. Notes she had a stress test in [place] with a cardiologist. This was normal.  No chest pain or pressure.    Past Medical History:  Diagnosis Date   Chronic back pain    HLD (hyperlipidemia)    Hypertension     Past Surgical History:  Procedure Laterality Date   BACK SURGERY      Current Medications: Current Outpatient Medications on File Prior to Visit  Medication Sig Dispense Refill   albuterol (VENTOLIN HFA) 108 (90 Base) MCG/ACT inhaler Inhale 1-2 puffs into the lungs every 4 (four) hours as needed. 8 g 0   amLODipine (NORVASC) 2.5 MG tablet Take 5 mg by mouth at bedtime.     aspirin 81 MG EC tablet Take 81 mg by mouth daily.     Calcium-Vitamin  D-Vitamin K 750-500-40 MG-UNT-MCG TABS Take 1 tablet by mouth daily.     cholecalciferol (VITAMIN D) 1000 units tablet Take 1,000 Units by mouth 2 (two) times daily.     DULoxetine (CYMBALTA) 60 MG capsule Take 60 mg by mouth at bedtime.     Ferrous Sulfate Dried 45 MG TBCR Take 45 mg by mouth daily.     lactobacillus acidophilus (BACID) TABS tablet Take 1 tablet by mouth daily.     losartan (COZAAR) 100 MG tablet Take 50 mg by mouth daily.     nicotine polacrilex (NICOTINE MINI) 2 MG lozenge Take 1 lozenge (2 mg total) by mouth every 2 (two) hours as needed for smoking cessation. 72 lozenge 3   omeprazole (PRILOSEC) 20 MG capsule Take 20 mg by mouth daily.     Oxycodone HCl 10 MG TABS Take 10 mg by mouth 3 (three) times daily.     polyethylene glycol (MIRALAX / GLYCOLAX) 17 g packet Take 17 g by mouth daily.     solifenacin (VESICARE) 5 MG tablet Take 5 mg by mouth daily.     tiZANidine (ZANAFLEX) 4 MG tablet Take 4 mg by mouth 2 (two) times daily.     No current facility-administered medications on file prior to visit.    Allergies:   Latex, Nsaids, Sulfa antibiotics, Ciprofloxacin, Diclofenac sodium, and  Naproxen   Social History   Socioeconomic History   Marital status: Married    Spouse name: Not on file   Number of children: Not on file   Years of education: Not on file   Highest education level: Not on file  Occupational History   Not on file  Tobacco Use   Smoking status: Every Day    Packs/day: 0.50    Years: 64.00    Additional pack years: 0.00    Total pack years: 32.00    Types: Cigarettes   Smokeless tobacco: Never  Vaping Use   Vaping Use: Never used  Substance and Sexual Activity   Alcohol use: Yes    Alcohol/week: 5.0 standard drinks of alcohol    Types: 5 Glasses of wine per week   Drug use: No   Sexual activity: Not on file  Other Topics Concern   Not on file  Social History Narrative   Not on file   Social Determinants of Health   Financial  Resource Strain: Not on file  Food Insecurity: No Food Insecurity (02/26/2023)   Hunger Vital Sign    Worried About Running Out of Food in the Last Year: Never true    Ran Out of Food in the Last Year: Never true  Transportation Needs: No Transportation Needs (02/26/2023)   PRAPARE - Administrator, Civil Service (Medical): No    Lack of Transportation (Non-Medical): No  Physical Activity: Not on file  Stress: Not on file  Social Connections: Not on file     Family History: The patient's family history includes Hypertension in an other family member. Parents and sister had cardiac disease  ROS:   Please see the history of present illness.     All other systems reviewed and are negative.  EKGs/Labs/Other Studies Reviewed:    The following studies were reviewed today:   EKG:  EKG is  ordered today.  The ekg ordered today demonstrates   04/15/2023- NSR, LAFB  Recent Labs: 02/26/2023: B Natriuretic Peptide 131.1 02/27/2023: ALT 20; BUN 43; Creatinine, Ser 1.25; Hemoglobin 11.1; Magnesium 2.4; Platelets 203; Potassium 4.1; Sodium 138  Recent Lipid Panel No results found for: "CHOL", "TRIG", "HDL", "CHOLHDL", "VLDL", "LDLCALC", "LDLDIRECT"   Risk Assessment/Calculations:    Physical Exam:    VS:  Vitals:   04/15/23 1436  BP: 112/76  Pulse: 64  SpO2: 99%    Wt Readings from Last 3 Encounters:  04/15/23 178 lb 12.8 oz (81.1 kg)  03/06/23 170 lb (77.1 kg)  02/26/23 170 lb (77.1 kg)     GEN: in a motorized wheelchair, Well nourished, well developed in no acute distress HEENT: Normal NECK: No JVD; No carotid bruits CARDIAC: RRR, no murmurs, rubs, gallops RESPIRATORY:  Clear to auscultation without rales, wheezing or rhonchi  ABDOMEN: Soft, non-tender, non-distended MUSCULOSKELETAL:  No edema; No deformity  SKIN: Warm and dry NEUROLOGIC:  Alert and oriented x 3 PSYCHIATRIC:  Normal affect   ASSESSMENT:    CAC SOB  Active smoker, had CAC on CT non gated  study. Seen more prominent in the LAD. Recommend CAD prevention strategy. No active CP, no infarct pattern on her EKG, her symptoms have improved, will medically manage. In terms of HFpEF, she has no signs of this today, BNP was not significant. Can continue to follow. Pending PFTs. - continue asa 81 mg daily - continue simvastatin 20 mg daily; will change to crestor 20 mg daily with LDL 80 mg/dL/ goal <09  mg /dL - recommend smoking cessation  HTN: Well controlled -continue losartan 50 mg daily -continue norvasc 5  mg at bedtime.  PLAN:    In order of problems listed above:  Switch simvastatin to crestor 20 mg daily Fasting lipids, LFTs 3 months Follow up in 6 months      Medication Adjustments/Labs and Tests Ordered: Current medicines are reviewed at length with the patient today.  Concerns regarding medicines are outlined above.  Orders Placed This Encounter  Procedures   Lipid panel   Hepatic function panel   EKG 12-Lead   Meds ordered this encounter  Medications   rosuvastatin (CRESTOR) 20 MG tablet    Sig: Take 1 tablet (20 mg total) by mouth daily.    Dispense:  90 tablet    Refill:  3    Patient Instructions  Medication Instructions:  STOP Simvastatin  START Rosuvastatin (Crestor) 20 mg daily  *If you need a refill on your cardiac medications before your next appointment, please call your pharmacy*  Lab Work:  Dr. Wyline Mood recommends that you return for lab work in 3 months:  Fasting Lipid Panel-DO NOT eat or drink past midnight. Okay to have water and/or black coffee only the morning of labs. Hepatic (Liver) Function Test.  If you have labs (blood work) drawn today and your tests are completely normal, you will receive your results only by: MyChart Message (if you have MyChart) OR A paper copy in the mail If you have any lab test that is abnormal or we need to change your treatment, we will call you to review the results.  Testing/Procedures: NONE ordered  at this time of appointment   Follow-Up: At [facility], you and your health needs are our priority.  As part of our continuing mission to provide you with exceptional heart care, we have created designated Provider Care Teams.  These Care Teams include your primary Cardiologist (physician) and Advanced Practice Providers (APPs -  Physician Assistants and Nurse Practitioners) who all work together to provide you with the care you need, when you need it.   Your next appointment:   6 month(s)  Provider:   Maisie Fus, MD     Other Instructions     Signed, Maisie Fus, MD  04/15/2023 4:47 PM    [facility]

## 2023-04-15 NOTE — Patient Instructions (Addendum)
Medication Instructions:  STOP Simvastatin  START Rosuvastatin (Crestor) 20 mg daily  *If you need a refill on your cardiac medications before your next appointment, please call your pharmacy*  Lab Work:  Dr. Wyline Mood recommends that you return for lab work in 3 months:  Fasting Lipid Panel-DO NOT eat or drink past midnight. Okay to have water and/or black coffee only the morning of labs. Hepatic (Liver) Function Test.  If you have labs (blood work) drawn today and your tests are completely normal, you will receive your results only by: MyChart Message (if you have MyChart) OR A paper copy in the mail If you have any lab test that is abnormal or we need to change your treatment, we will call you to review the results.  Testing/Procedures: NONE ordered at this time of appointment   Follow-Up: At [facility], you and your health needs are our priority.  As part of our continuing mission to provide you with exceptional heart care, we have created designated Provider Care Teams.  These Care Teams include your primary Cardiologist (physician) and Advanced Practice Providers (APPs -  Physician Assistants and Nurse Practitioners) who all work together to provide you with the care you need, when you need it.   Your next appointment:   6 month(s)  Provider:   Maisie Fus, MD     Other Instructions

## 2023-05-13 ENCOUNTER — Ambulatory Visit: Payer: Medicare Other | Attending: Student in an Organized Health Care Education/Training Program

## 2023-05-13 DIAGNOSIS — R0602 Shortness of breath: Secondary | ICD-10-CM | POA: Diagnosis present

## 2023-05-13 LAB — PULMONARY FUNCTION TEST ARMC ONLY
DL/VA % pred: 52 %
DL/VA: 2.21 ml/min/mmHg/L
DLCO unc % pred: 81 %
DLCO unc: 13.21 ml/min/mmHg
FEF 25-75 Post: 1.05 L/sec
FEF 25-75 Pre: 0.86 L/sec
FEF2575-%Change-Post: 22 %
FEF2575-%Pred-Post: 96 %
FEF2575-%Pred-Pre: 79 %
FEV1-%Change-Post: 6 %
FEV1-%Pred-Post: 82 %
FEV1-%Pred-Pre: 77 %
FEV1-Post: 1.23 L
FEV1-Pre: 1.16 L
FEV1FVC-%Change-Post: 0 %
FEV1FVC-%Pred-Pre: 101 %
FEV6-%Change-Post: 0 %
FEV6-%Pred-Post: 81 %
FEV6-%Pred-Pre: 80 %
FEV6-Post: 1.56 L
FEV6-Pre: 1.55 L
FEV6FVC-%Pred-Post: 106 %
FEV6FVC-%Pred-Pre: 106 %
FVC-%Change-Post: 5 %
FVC-%Pred-Post: 80 %
FVC-%Pred-Pre: 75 %
FVC-Post: 1.64 L
FVC-Pre: 1.55 L
Post FEV1/FVC ratio: 75 %
Post FEV6/FVC ratio: 100 %
Pre FEV1/FVC ratio: 75 %
Pre FEV6/FVC Ratio: 100 %
RV % pred: 88 %
RV: 1.98 L
TLC % pred: 79 %
TLC: 3.52 L

## 2023-05-13 MED ORDER — ALBUTEROL SULFATE (2.5 MG/3ML) 0.083% IN NEBU
2.5000 mg | INHALATION_SOLUTION | Freq: Once | RESPIRATORY_TRACT | Status: AC
  Administered 2023-05-13: 2.5 mg via RESPIRATORY_TRACT
  Filled 2023-05-13: qty 3

## 2023-05-16 ENCOUNTER — Ambulatory Visit: Payer: Medicare Other | Admitting: Student in an Organized Health Care Education/Training Program

## 2023-05-19 ENCOUNTER — Ambulatory Visit (INDEPENDENT_AMBULATORY_CARE_PROVIDER_SITE_OTHER): Payer: Medicare Other | Admitting: Student in an Organized Health Care Education/Training Program

## 2023-05-19 ENCOUNTER — Encounter: Payer: Self-pay | Admitting: Student in an Organized Health Care Education/Training Program

## 2023-05-19 VITALS — BP 118/60 | HR 61 | Temp 97.7°F | Ht 60.0 in | Wt 173.0 lb

## 2023-05-19 DIAGNOSIS — R0602 Shortness of breath: Secondary | ICD-10-CM

## 2023-05-19 DIAGNOSIS — Z720 Tobacco use: Secondary | ICD-10-CM

## 2023-05-19 DIAGNOSIS — F17210 Nicotine dependence, cigarettes, uncomplicated: Secondary | ICD-10-CM

## 2023-05-19 NOTE — Progress Notes (Signed)
Assessment & Plan:   #Shortness of breath #Bronchiectasis  Her shortness of breath is in the setting of smoking history as well as diastolic dysfunction noted on TTE.  On exam, her lungs are clear with faint bibasilar Rales and no wheezing. Pulmonary function testing does not show obstruction and is not suggestive of COPD. There is mild restriction noted on the PFT based on lung volumes, thou the single breath lung volume for DLCO was higher than TLC, making me less suspicious. That said, she does have kyphosis which could result in restriction.  Overall, pulmonary function testing is re-assuring, and she does not have COPD. I suspect with the normal lung volume on DLCO testing that the mildly reduced TLC is an erroneous value, and even if true, the degree of restriction is not congruent with her symptoms. That said, she feels symptomatically better with increased physical therapy.   The CT scan of the chest, while showing normal lung parenchyma, does note lower lobe bronchiectasis.  I suspect this is secondary to chronically retained secretions in the setting of her kyphosis and immobility. The CT scan also has a calcified granuloma in the LLL with associated calcified lymph nodes. There are also calcifications in the Spleen. 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 I had prescribed a pulmonary clearance regimen that she's been compliant with. Patient instructed to call our office should she develop symptoms of worsening cough, increased sputum production, or fevers.  -continue pulmonary clearance regimen -follow up with cardiology  #Tobacco abuse  Again counseled on the importance of smoking cessation, patient continues to try and quit, using lozenges and gum, does not want to use patches.   Return in about 1 year (around 05/18/2024).  I spent 30 minutes caring for this patient today, including preparing to see the patient, obtaining a medical history ,  reviewing a separately obtained history, performing a medically appropriate examination and/or evaluation, counseling and educating the patient/family/caregiver, documenting clinical information in the electronic health record, and independently interpreting results (not separately reported/billed) and communicating results to the patient/family/caregiver. 4 min spent on tobacco cessation counseling.  Raechel Chute, MD [facility] 05/19/2023 11:21 AM    End of visit medications:  No orders of the defined types were placed in this encounter.    Current Outpatient Medications:    albuterol (VENTOLIN HFA) 108 (90 Base) MCG/ACT inhaler, Inhale 1-2 puffs into the lungs every 4 (four) hours as needed., Disp: 8 g, Rfl: 0   amLODipine (NORVASC) 2.5 MG tablet, Take 5 mg by mouth at bedtime., Disp: , Rfl:    aspirin 81 MG EC tablet, Take 81 mg by mouth daily., Disp: , Rfl:    Calcium-Vitamin D-Vitamin K 750-500-40 MG-UNT-MCG TABS, Take 1 tablet by mouth daily., Disp: , Rfl:    cholecalciferol (VITAMIN D) 1000 units tablet, Take 1,000 Units by mouth 2 (two) times daily., Disp: , Rfl:    DULoxetine (CYMBALTA) 60 MG capsule, Take 60 mg by mouth at bedtime., Disp: , Rfl:    Ferrous Sulfate Dried 45 MG TBCR, Take 45 mg by mouth daily., Disp: , Rfl:    lactobacillus acidophilus (BACID) TABS tablet, Take 1 tablet by mouth daily., Disp: , Rfl:    losartan (COZAAR) 100 MG tablet, Take 50 mg by mouth daily., Disp: , Rfl:    nicotine polacrilex (NICOTINE MINI) 2 MG lozenge, Take 1 lozenge (2 mg total) by mouth every 2 (two) hours as needed for smoking cessation.,  Disp: 72 lozenge, Rfl: 3   omeprazole (PRILOSEC) 20 MG capsule, Take 20 mg by mouth daily., Disp: , Rfl:    Oxycodone HCl 10 MG TABS, Take 10 mg by mouth 3 (three) times daily., Disp: , Rfl:    polyethylene glycol (MIRALAX / GLYCOLAX) 17 g packet, Take 17 g by mouth daily., Disp: , Rfl:    rosuvastatin (CRESTOR) 20 MG tablet, Take 1  tablet (20 mg total) by mouth daily., Disp: 90 tablet, Rfl: 3   solifenacin (VESICARE) 5 MG tablet, Take 5 mg by mouth daily., Disp: , Rfl:    tiZANidine (ZANAFLEX) 4 MG tablet, Take 4 mg by mouth 2 (two) times daily., Disp: , Rfl:    Subjective:   PATIENT ID: Madison Benson GENDER: female DOB: [birth year], MRN: [id number]  Chief Complaint  Patient presents with   Follow-up    Prod cough with small amount of clear sputum, wheezing and SOB with exertion.     HPI  Patient is an 82 year old female presenting to clinic for follow up on shortness of breath.  I saw her in March for the evaluation of shortness of breath, at which point PFT orders were placed and the patient was referred to cardiology. We also started a pulmonary clearance regimen for bronchiectasis noted on imaging. Since our last visit, she's been evaluated by cardiology and has been partaking in physical therapy at home. She feels her symptoms are improved. Specifically, she feels her shortness of breath is better, as is her cough. She is compliant with her regimen for pulmonary clearance.  Patient was admitted to the hospital on 02/26/2023 for 36 hours for the workup and management of acute hypoxic respiratory failure.  The patient presented to the ER because of increased shortness of breath as well as hypoxia, and was admitted and managed for a combination of COPD exacerbation as well as decompensated heart failure (BNP was 131 pg/mL 02/26/2023).  She was diuresed, given prednisone, nebulizers, and antibiotics with notable improvement.   Patient reports that she has been short of breath for a long time now.  She is short of breath with any exertion and is very limited in her mobility and level of activity secondary to multiple musculoskeletal complaints.  She has had multiple back and joint surgeries and currently mostly moves around with her scooter.  She is unable to take a deep breath but does not feel short of breath at rest.  She had not been on inhalers in the past but was started on albuterol following her recent admission.  Her other medical problems include hypertension for which she is medically managed and does endorse labile blood pressure.   Patient used to work as a Licensed conveyancer.  She is a smoker and has done so for most of her life.  She currently smokes half a pack a day and has between 30 and 40 pack years of smoking history.  She would like to quit.  No other exposures reported. She did not feel she was going to be compliant with the nicotine patches so did not use them. She is using the lozenges and gum.  Ancillary information including prior medications, full medical/surgical/family/social histories, and PFTs (when available) are listed below and have been reviewed.   Review of Systems  Constitutional:  Negative for chills, fever, malaise/fatigue and weight loss.  Respiratory:  Positive for shortness of breath. Negative for cough, hemoptysis, sputum production and wheezing.      Objective:   Vitals:  05/19/23 1041  BP: 118/60  Pulse: 61  Temp: 97.7 F (36.5 C)  TempSrc: Temporal  SpO2: 94%  Weight: 173 lb (78.5 kg)  Height: 5' (1.524 m)   94% on RA BMI Readings from Last 3 Encounters:  05/19/23 33.79 kg/m  04/15/23 34.92 kg/m  03/06/23 33.20 kg/m   Wt Readings from Last 3 Encounters:  05/19/23 173 lb (78.5 kg)  04/15/23 178 lb 12.8 oz (81.1 kg)  03/06/23 170 lb (77.1 kg)    Physical Exam Constitutional:      General: She is not in acute distress.    Appearance: She is obese. She is not ill-appearing.  HENT:     Mouth/Throat:     Mouth: Mucous membranes are moist.  Neck:     Comments: kyphosis Cardiovascular:     Rate and Rhythm: Normal rate and regular rhythm.     Pulses: Normal pulses.     Heart sounds: Normal heart sounds.  Pulmonary:     Breath sounds: Normal breath sounds. No wheezing, rhonchi or rales.  Abdominal:     Palpations: Abdomen is  soft.  Musculoskeletal:     Right lower leg: Edema present.     Left lower leg: Edema present.  Neurological:     Mental Status: She is alert.       Ancillary Information    Past Medical History:  Diagnosis Date   Chronic back pain    HLD (hyperlipidemia)    Hypertension      Family History  Problem Relation Age of Onset   Hypertension Other      Past Surgical History:  Procedure Laterality Date   BACK SURGERY      Social History   Socioeconomic History   Marital status: Married    Spouse name: Not on file   Number of children: Not on file   Years of education: Not on file   Highest education level: Not on file  Occupational History   Not on file  Tobacco Use   Smoking status: Every Day    Packs/day: 0.50    Years: 64.00    Additional pack years: 0.00    Total pack years: 32.00    Types: Cigarettes   Smokeless tobacco: Never   Tobacco comments:    7 cigarettes daily- 05/19/23  Vaping Use   Vaping Use: Never used  Substance and Sexual Activity   Alcohol use: Yes    Alcohol/week: 5.0 standard drinks of alcohol    Types: 5 Glasses of wine per week   Drug use: No   Sexual activity: Not on file  Other Topics Concern   Not on file  Social History Narrative   Not on file   Social Determinants of Health   Financial Resource Strain: Not on file  Food Insecurity: No Food Insecurity (02/26/2023)   Hunger Vital Sign    Worried About Running Out of Food in the Last Year: Never true    Ran Out of Food in the Last Year: Never true  Transportation Needs: No Transportation Needs (02/26/2023)   PRAPARE - Administrator, Civil Service (Medical): No    Lack of Transportation (Non-Medical): No  Physical Activity: Not on file  Stress: Not on file  Social Connections: Not on file  Intimate Partner Violence: Not At Risk (02/26/2023)   Humiliation, Afraid, Rape, and Kick questionnaire    Fear of Current or Ex-Partner: No    Emotionally Abused: No     Physically Abused:  No    Sexually Abused: No     Allergies  Allergen Reactions   Latex Rash    After band aid left for couple days After band aid left for couple days After band aid left for couple days    Nsaids Diarrhea and Nausea And Vomiting   Sulfa Antibiotics Diarrhea and Nausea And Vomiting   Ciprofloxacin Itching   Diclofenac Sodium Diarrhea   Naproxen Diarrhea    Any anti-inflammatory causes severe diarrhea Any anti-inflammatory causes severe diarrhea Any anti-inflammatory causes severe diarrhea      CBC    Component Value Date/Time   WBC 6.7 02/27/2023 0441   RBC 3.63 (L) 02/27/2023 0441   RBC 3.63 (L) 02/27/2023 0441   HGB 11.1 (L) 02/27/2023 0441   HCT 33.8 (L) 02/27/2023 0441   PLT 203 02/27/2023 0441   MCV 93.1 02/27/2023 0441   MCH 30.6 02/27/2023 0441   MCHC 32.8 02/27/2023 0441   RDW 13.4 02/27/2023 0441   LYMPHSABS 0.5 (L) 02/27/2023 0441   MONOABS 0.1 02/27/2023 0441   EOSABS 0.0 02/27/2023 0441   BASOSABS 0.0 02/27/2023 0441    Pulmonary Functions Testing Results:    Latest Ref Rng & Units 05/13/2023   11:44 AM  PFT Results  FVC-Pre L 1.55   FVC-Predicted Pre % 75   FVC-Post L 1.64   FVC-Predicted Post % 80   Pre FEV1/FVC % % 75   Post FEV1/FCV % % 75   FEV1-Pre L 1.16   FEV1-Predicted Pre % 77   FEV1-Post L 1.23   DLCO uncorrected ml/min/mmHg 13.21   DLCO UNC% % 81   DLVA Predicted % 52   TLC L 3.52   TLC % Predicted % 79   RV % Predicted % 88     Outpatient Medications Prior to Visit  Medication Sig Dispense Refill   albuterol (VENTOLIN HFA) 108 (90 Base) MCG/ACT inhaler Inhale 1-2 puffs into the lungs every 4 (four) hours as needed. 8 g 0   amLODipine (NORVASC) 2.5 MG tablet Take 5 mg by mouth at bedtime.     aspirin 81 MG EC tablet Take 81 mg by mouth daily.     Calcium-Vitamin D-Vitamin K 750-500-40 MG-UNT-MCG TABS Take 1 tablet by mouth daily.     cholecalciferol (VITAMIN D) 1000 units tablet Take 1,000 Units by mouth 2  (two) times daily.     DULoxetine (CYMBALTA) 60 MG capsule Take 60 mg by mouth at bedtime.     Ferrous Sulfate Dried 45 MG TBCR Take 45 mg by mouth daily.     lactobacillus acidophilus (BACID) TABS tablet Take 1 tablet by mouth daily.     losartan (COZAAR) 100 MG tablet Take 50 mg by mouth daily.     nicotine polacrilex (NICOTINE MINI) 2 MG lozenge Take 1 lozenge (2 mg total) by mouth every 2 (two) hours as needed for smoking cessation. 72 lozenge 3   omeprazole (PRILOSEC) 20 MG capsule Take 20 mg by mouth daily.     Oxycodone HCl 10 MG TABS Take 10 mg by mouth 3 (three) times daily.     polyethylene glycol (MIRALAX / GLYCOLAX) 17 g packet Take 17 g by mouth daily.     rosuvastatin (CRESTOR) 20 MG tablet Take 1 tablet (20 mg total) by mouth daily. 90 tablet 3   solifenacin (VESICARE) 5 MG tablet Take 5 mg by mouth daily.     tiZANidine (ZANAFLEX) 4 MG tablet Take 4 mg by mouth 2 (two)  times daily.     No facility-administered medications prior to visit.

## 2023-08-14 ENCOUNTER — Other Ambulatory Visit (INDEPENDENT_AMBULATORY_CARE_PROVIDER_SITE_OTHER): Payer: Self-pay | Admitting: Nurse Practitioner

## 2023-08-14 DIAGNOSIS — M7989 Other specified soft tissue disorders: Secondary | ICD-10-CM

## 2023-08-18 ENCOUNTER — Ambulatory Visit (INDEPENDENT_AMBULATORY_CARE_PROVIDER_SITE_OTHER): Payer: Medicare Other

## 2023-08-18 DIAGNOSIS — M7989 Other specified soft tissue disorders: Secondary | ICD-10-CM | POA: Diagnosis not present

## 2023-08-29 ENCOUNTER — Ambulatory Visit (INDEPENDENT_AMBULATORY_CARE_PROVIDER_SITE_OTHER): Payer: Medicare Other | Admitting: Vascular Surgery

## 2023-08-29 ENCOUNTER — Encounter (INDEPENDENT_AMBULATORY_CARE_PROVIDER_SITE_OTHER): Payer: Self-pay | Admitting: Vascular Surgery

## 2023-08-29 VITALS — BP 109/58 | HR 51 | Resp 16

## 2023-08-29 DIAGNOSIS — E785 Hyperlipidemia, unspecified: Secondary | ICD-10-CM | POA: Diagnosis not present

## 2023-08-29 DIAGNOSIS — M7989 Other specified soft tissue disorders: Secondary | ICD-10-CM

## 2023-08-29 DIAGNOSIS — N183 Chronic kidney disease, stage 3 unspecified: Secondary | ICD-10-CM

## 2023-08-29 DIAGNOSIS — I10 Essential (primary) hypertension: Secondary | ICD-10-CM

## 2023-08-29 DIAGNOSIS — I890 Lymphedema, not elsewhere classified: Secondary | ICD-10-CM | POA: Diagnosis not present

## 2023-08-29 NOTE — Assessment & Plan Note (Signed)
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 The symptoms are significant and bothersome on a daily basis. At this point, I think a lymphedema pump would be an excellent adjuvant therapy to try to improve their symptoms. I have discussed the reason and rationale for the lymphedema pump. This would be in addition to the conservative therapies and not to replace them. I will plan to see the patient back in several months to assess their response to the addition of the lymphedema pump as well as continued conservative therapy.

## 2023-08-29 NOTE — Progress Notes (Signed)
MRN : [id number]  Madison Benson is a 82 y.o. (01/12/1941) female who presents with chief complaint of  Chief Complaint  Patient presents with   Follow-up    Ble swelling/ ultrasound results  .  History of Present Illness: Patient returns today in follow up of her leg swelling and lymphedema.  Her symptoms are doing quite well and we saw her about 10 months ago.  Until a few months ago, she really was doing pretty well.  Over the past few months, her leg swelling has worsened and she has developed more stasis dermatitis and peau d'orange appearance of her legs particularly on the right side.  She has begun wearing her compression socks again.  She had stopped wearing them because she was really not having any swelling and she was having lots of issues with a pressure area on her right foot.  Since wearing stockings, this has helped the swelling and skin changes a little.  She is now been wearing them for over 10 weeks diligently on a daily basis.  She elevates her legs.  She exercises.  Her hyperpigmentation has become quite prominent.  No current drainage or open wounds.  Current Outpatient Medications  Medication Sig Dispense Refill   amLODipine (NORVASC) 2.5 MG tablet Take 5 mg by mouth at bedtime.     aspirin 81 MG EC tablet Take 81 mg by mouth daily.     Calcium-Vitamin D-Vitamin K 750-500-40 MG-UNT-MCG TABS Take 1 tablet by mouth daily.     cholecalciferol (VITAMIN D) 1000 units tablet Take 1,000 Units by mouth 2 (two) times daily.     cyanocobalamin (VITAMIN B12) 1000 MCG tablet Take 1,000 mcg by mouth daily.     DULoxetine (CYMBALTA) 60 MG capsule Take 60 mg by mouth at bedtime.     Ferrous Sulfate Dried 45 MG TBCR Take 45 mg by mouth daily.     lactobacillus acidophilus (BACID) TABS tablet Take 1 tablet by mouth daily.     losartan (COZAAR) 50 MG tablet Take 50 mg by mouth daily.     Multiple Vitamins-Minerals (PRESERVISION AREDS 2 PO) Take 1 tablet by mouth 2 (two) times  daily.     Omega-3 Fatty Acids (FISH OIL) 1200 MG CAPS Take 1 capsule by mouth daily after breakfast.     omeprazole (PRILOSEC) 20 MG capsule Take 20 mg by mouth daily.     Oxycodone HCl 10 MG TABS Take 10 mg by mouth 3 (three) times daily.     polyethylene glycol (MIRALAX / GLYCOLAX) 17 g packet Take 17 g by mouth daily.     rosuvastatin (CRESTOR) 20 MG tablet Take 1 tablet (20 mg total) by mouth daily. 90 tablet 3   solifenacin (VESICARE) 5 MG tablet Take 5 mg by mouth daily.     tiZANidine (ZANAFLEX) 4 MG tablet Take 4 mg by mouth 2 (two) times daily.     Turmeric (QC TUMERIC COMPLEX PO) Take by mouth.     albuterol (VENTOLIN HFA) 108 (90 Base) MCG/ACT inhaler Inhale 1-2 puffs into the lungs every 4 (four) hours as needed. (Patient not taking: Reported on 08/29/2023) 8 g 0   No current facility-administered medications for this visit.    Past Medical History:  Diagnosis Date   Chronic back pain    HLD (hyperlipidemia)    Hypertension     Past Surgical History:  Procedure Laterality Date   BACK SURGERY       Social History   Tobacco  Use   Smoking status: Every Day    Current packs/day: 0.50    Average packs/day: 0.5 packs/day for 64.0 years (32.0 ttl pk-yrs)    Types: Cigarettes   Smokeless tobacco: Never   Tobacco comments:    7 cigarettes daily- 05/19/23  Vaping Use   Vaping status: Never Used  Substance Use Topics   Alcohol use: Yes    Alcohol/week: 5.0 standard drinks of alcohol    Types: 5 Glasses of wine per week   Drug use: No      Family History  Problem Relation Age of Onset   Hypertension Other   No bleeding or clotting disorders No aneurysms  Allergies  Allergen Reactions   Latex Rash    After band aid left for couple days After band aid left for couple days After band aid left for couple days    Nsaids Diarrhea and Nausea And Vomiting   Sulfa Antibiotics Diarrhea and Nausea And Vomiting   Ciprofloxacin Itching   Diclofenac Sodium Diarrhea    Naproxen Diarrhea    Any anti-inflammatory causes severe diarrhea Any anti-inflammatory causes severe diarrhea Any anti-inflammatory causes severe diarrhea     REVIEW OF SYSTEMS (Negative unless checked)   Constitutional: [] Weight loss  [] Fever  [] Chills Cardiac: [] Chest pain   [] Chest pressure   [] Palpitations   [] Shortness of breath when laying flat   [] Shortness of breath at rest   [] Shortness of breath with exertion. Vascular:  [] Pain in legs with walking   [] Pain in legs at rest   [] Pain in legs when laying flat   [] Claudication   [] Pain in feet when walking  [] Pain in feet at rest  [] Pain in feet when laying flat   [] History of DVT   [] Phlebitis   [x] Swelling in legs   [] Varicose veins   [] Non-healing ulcers Pulmonary:   [] Uses home oxygen   [] Productive cough   [] Hemoptysis   [] Wheeze  [] COPD   [] Asthma Neurologic:  [] Dizziness  [] Blackouts   [] Seizures   [] History of stroke   [] History of TIA  [] Aphasia   [] Temporary blindness   [] Dysphagia   [] Weakness or numbness in arms   [] Weakness or numbness in legs Musculoskeletal:  [x] Arthritis   [] Joint swelling   [x] Joint pain   [x] Low back pain Hematologic:  [] Easy bruising  [] Easy bleeding   [] Hypercoagulable state   [] Anemic   Gastrointestinal:  [] Blood in stool   [] Vomiting blood  [] Gastroesophageal reflux/heartburn   [] Abdominal pain Genitourinary:  [] Chronic kidney disease   [] Difficult urination  [] Frequent urination  [] Burning with urination   [] Hematuria Skin:  [] Rashes   [] Ulcers   [] Wounds Psychological:  [] History of anxiety   []  History of major depression.    Physical Examination  BP (!) 109/58 (BP Location: Left Arm)   Pulse (!) 51   Resp 16  Gen:  WD/WN, NAD Head: [place]/AT, No temporalis wasting. Ear/Nose/Throat: Hearing grossly intact, nares w/o erythema or drainage Eyes: Conjunctiva clear. Sclera non-icteric Neck: Supple.  Trachea midline Pulmonary:  Good air movement, no use of accessory muscles.  Cardiac:  Bradycardic Vascular:  Vessel Right Left  Radial Palpable Palpable           Musculoskeletal: M/S 5/5 throughout.  Marked hyperpigmentation is present in both lower extremities really with a peau d'orange appearance on the right side.  2+ right lower extremity edema, 1+ left lower extremity edema.  Significant arthritic changes are present with interning of the feet and knees. Neurologic: Sensation grossly intact  in extremities.  Symmetrical.  Speech is fluent.  Psychiatric: Judgment intact, Mood & affect appropriate for pt's clinical situation. Dermatologic: No rashes or ulcers noted.  No cellulitis or open wounds.      Labs No results found for this or any previous visit (from the past 2160 hour(s)).  Radiology VAS [place] LOWER EXTREMITY VENOUS REFLUX  Result Date: 08/19/2023  Lower Venous Reflux Study Patient Name:  Madison Benson  Date of Exam:   08/18/2023 Medical Rec #: [id number]             Accession #:    [id number] Date of Birth: [birth year]              Patient Gender: F Patient Age:   85 years Exam Location:  [facility] Procedure:      VAS [place] LOWER EXTREMITY VENOUS REFLUX Referring Phys: Sheppard Plumber --------------------------------------------------------------------------------  Indications: Pain, Swelling, and Edema.  Performing Technologist: Hardie Lora RVT  Examination Guidelines: A complete evaluation includes B-mode imaging, spectral Doppler, color Doppler, and power Doppler as needed of all accessible portions of each vessel. Bilateral testing is considered an integral part of a complete examination. Limited examinations for reoccurring indications may be performed as noted. The reflux portion of the exam is performed with the patient in reverse Trendelenburg. Significant venous reflux is defined as >500 ms in the superficial venous system, and >1 second in the deep venous system.  Venous Reflux Times  +--------------+---------+------+-----------+------------+--------------+ RIGHT         Reflux NoRefluxReflux TimeDiameter cmsComments                               Yes                                        +--------------+---------+------+-----------+------------+--------------+ CFV                     yes   >1 second                            +--------------+---------+------+-----------+------------+--------------+ FV prox       no                                                   +--------------+---------+------+-----------+------------+--------------+ FV mid        no                                                   +--------------+---------+------+-----------+------------+--------------+ FV dist       no                                                   +--------------+---------+------+-----------+------------+--------------+ Popliteal               yes   >1 second                            +--------------+---------+------+-----------+------------+--------------+  GSV at [facility]              yes    >500 ms      0.77                   +--------------+---------+------+-----------+------------+--------------+ GSV prox thigh          yes    >500 ms      0.51                   +--------------+---------+------+-----------+------------+--------------+ GSV mid thigh no                            0.47                   +--------------+---------+------+-----------+------------+--------------+ GSV dist thighno                            0.49                   +--------------+---------+------+-----------+------------+--------------+ GSV at knee   no                            0.47                   +--------------+---------+------+-----------+------------+--------------+ GSV prox calf no                            0.39                   +--------------+---------+------+-----------+------------+--------------+ SSV Pop Fossa no                             0.14                   +--------------+---------+------+-----------+------------+--------------+ SSV prox calf                                       Not visualized +--------------+---------+------+-----------+------------+--------------+ SSV mid calf                                        Not visualized +--------------+---------+------+-----------+------------+--------------+  +--------------+---------+------+-----------+------------+--------+ LEFT          Reflux NoRefluxReflux TimeDiameter cmsComments                         Yes                                  +--------------+---------+------+-----------+------------+--------+ CFV           no                                             +--------------+---------+------+-----------+------------+--------+ FV prox       no                                             +--------------+---------+------+-----------+------------+--------+  FV mid        no                                             +--------------+---------+------+-----------+------------+--------+ FV dist       no                                             +--------------+---------+------+-----------+------------+--------+ Popliteal     no                                             +--------------+---------+------+-----------+------------+--------+ GSV at [facility]    no                            0.49             +--------------+---------+------+-----------+------------+--------+ GSV prox thighno                            0.46             +--------------+---------+------+-----------+------------+--------+ GSV mid thigh no                            0.37             +--------------+---------+------+-----------+------------+--------+ GSV dist thighno                            0.43             +--------------+---------+------+-----------+------------+--------+ GSV at knee   no                             0.27             +--------------+---------+------+-----------+------------+--------+ GSV prox calf no                            0.24             +--------------+---------+------+-----------+------------+--------+ SSV Pop Fossa no                            0.25             +--------------+---------+------+-----------+------------+--------+ SSV prox calf no                            0.32             +--------------+---------+------+-----------+------------+--------+ SSV mid calf  no                            0.32             +--------------+---------+------+-----------+------------+--------+   Summary: Right: - No evidence of deep vein thrombosis seen in the right lower extremity, from the common femoral through the popliteal veins. - Venous reflux is noted  in the right common femoral vein. - Venous reflux is noted in the right sapheno-femoral junction. - Venous reflux is noted in the right greater saphenous vein in the thigh. - Venous reflux is noted in the right popliteal vein. - Small saphenous vein was non compressible at the knee and not visualized in the calf consistent with chronic thrombus.  Left: - No evidence of deep vein thrombosis seen in the left lower extremity, from the common femoral through the popliteal veins. - No evidence of superficial venous thrombosis in the left lower extremity. - No evidence of superficial venous reflux seen in the left greater saphenous vein. - No evidence of superficial venous reflux seen in the left short saphenous vein.  *See table(s) above for measurements and observations. Electronically signed by Festus Barren MD on 08/19/2023 at 10:35:44 AM.    Final     Assessment/Plan HLD (hyperlipidemia) lipid control important in reducing the progression of atherosclerotic disease. Continue statin therapy     CKD (chronic kidney disease) stage 3, GFR 30-59 ml/min (HCC) Can certainly worsen LE edema.     HTN (hypertension) blood  pressure control important in reducing the progression of atherosclerotic disease. On appropriate oral medications.    Lymphedema Despite appropriate conservative therapies including 20-30 mmHg compression stockings, exercise, and increased leg elevation the patient continues to have symptoms of stage 2 lymphedema. The symptoms are significant and bothersome on a daily basis. At this point, I think a lymphedema pump would be an excellent adjuvant therapy to try to improve their symptoms. I have discussed the reason and rationale for the lymphedema pump. This would be in addition to the conservative therapies and not to replace them. I will plan to see the patient back in several months to assess their response to the addition of the lymphedema pump as well as continued conservative therapy.     Festus Barren, MD  08/29/2023 11:49 AM    This note was created with Dragon medical transcription system.  Any errors from dictation are purely unintentional

## 2023-09-17 ENCOUNTER — Encounter: Payer: Self-pay | Admitting: Cardiology

## 2023-09-17 NOTE — Progress Notes (Unsigned)
Cardiology Office Note:  .   Date:  09/18/2023  ID:  Madison Benson, DOB 10-Oct-1941, MRN [id number] PCP: Jerl Mina, MD  [facility] Providers Cardiologist:  Maisie Fus, MD    History of Present Illness: .   Madison Benson is a 82 y.o. female with a past medical history of hyperlipidemia, hypertension, active smoker, shortness of breath, lymphedema, and chronic back pain, who is here today for follow-up.  Previously had been admitted to the hospital in February 2024 and discharged the next day due to shortness of breath.  She had called EMS and sats were noted to be 88% and she had an increased work of breathing.  She was placed on 6 L of O2 via nasal cannula and route to the hospital.  Echocardiogram revealed a normal EF.  There was concern of COPD exacerbation.  Chest x-ray did not show significant edema.  CT of the chest showed severe CAC and severe bronchiectasis.  It was recommended she follow-up with pulmonary and she had pending PFTs with Dr. Aundria Rud.  She was last seen in clinic/16/24 by Dr. Judie Petit.  Madison Benson.  At that time she was not feeling well.  She was in a wheelchair from deconditioning.  She was continued on her current medications with changes made from simvastatin to rosuvastatin with updated lipid panel in 3 months.  She was continued on her antihypertensives.  Smoking cessation was recommended.  She returns to clinic today stating that she feels better today than she has on most days. She has been keeping a log of her blood pressures at home and when she has been taking her medications.  She states that she was recently on a blood pressure cuff correlated between her blood pressure and a manual blood pressure and they are very similar.  She has noted that she has been undergoing physical therapy and the therapist has also noted a lower blood pressure.  She typically notes these blood pressures monitor she is taking her a.m. medications which include losartan 50 mg  and oxycodone 10 mg and various vitamins.  She denies any chest pain or palpitations.  She has chronic shortness of breath with a congested but occasionally productive cough that is unchanged. She also has lymphedema.  She has had swelling that has been up and down.  She continues to have regular follow-up with [facility].  She denies any hospitalizations or visits to the emergency department.  ROS: 10 point review of systems has been reviewed and considered negative with exception of what is been listed in the HPI.  Studies Reviewed: Marland Kitchen   EKG Interpretation Date/Time:  Thursday September 18 2023 11:37:02 EDT Ventricular Rate:  57 PR Interval:  146 QRS Duration:  100 QT Interval:  444 QTC Calculation: 432 R Axis:   -47  Text Interpretation: Sinus bradycardia Left anterior fascicular block Nonspecific ST abnormality When compared with ECG of 26-Feb-2023 00:40, PREVIOUS ECG IS PRESENT Confirmed by Charlsie Quest ([id number]) on 09/18/2023 11:38:34 AM   TTE 02/26/23 1. Left ventricular ejection fraction, by estimation, is 60 to 65%. The  left ventricle has normal function. The left ventricle has no regional  wall motion abnormalities. There is moderate left ventricular hypertrophy.  Left ventricular diastolic  parameters are consistent with Grade I diastolic dysfunction (impaired  relaxation).   2. Right ventricular systolic function is normal. The right ventricular  size is normal.   3. The mitral valve is normal in structure. Mild mitral valve  regurgitation. No evidence of  mitral stenosis.   4. The aortic valve is normal in structure. Aortic valve regurgitation is  mild. No aortic stenosis is present.   5. The inferior vena cava is normal in size with greater than 50%  respiratory variability, suggesting right atrial pressure of 3 mmHg.  Risk Assessment/Calculations:             Physical Exam:   VS:  BP 126/69 (BP Location: Left Arm, Patient Position: Sitting, Cuff Size: Normal)   Pulse (!) 57    Ht 5' (1.524 m)   Wt 172 lb (78 kg)   BMI 33.59 kg/m    Wt Readings from Last 3 Encounters:  09/18/23 172 lb (78 kg)  05/19/23 173 lb (78.5 kg)  04/15/23 178 lb 12.8 oz (81.1 kg)    GEN: Well nourished, well developed in no acute distress NECK: No JVD; No carotid bruits CARDIAC: RRR, no murmurs, rubs, gallops RESPIRATORY:  Clear to auscultation without rales, wheezing or rhonchi  ABDOMEN: Soft, non-tender, non-distended EXTREMITIES: Trace pretibial edema, compression stockings on to the right lower extremity; No deformity   ASSESSMENT AND PLAN: .   Primary hypertension with a blood pressure today 126/69.  According to blood pressure log patient has been keeping she has been fluctuating blood pressures.  She has noticed primarily her blood pressure drops in the morning and is in the 160s systolic in the evenings.  Upon further review of her medications and her blood pressure she is currently taking losartan 50 mg twice daily and amlodipine 5 mg at bedtime.  Her blood pressure started to drop but when she started taking her 50 mg of losartan in the morning with her 10 mg of oxycodone.  Patient has been advised to separate those Medications with taking her oxygen when she first wakes up between 730 and 830 and then 2 to 3 hours later to take her first dose of losartan with taking her blood pressures in between to determine the drop of her actual blood pressure.  She has been encouraged to maintain a blood pressure log prior to return appointment.  She requires no refills to her medications today.  Coronary artery calcifications noted on CT scan seen more prominently in the region of the LAD.  Recommended for preventative strategies.  Patient continues to deny any chest pain.  She is continued on aspirin 81 mg daily and rosuvastatin 20 mg daily.  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  Mixed hyperlipidemia with an LDL of 67.  She  has been continued on rosuvastatin 20 mg daily.  This continues to be monitored by her PCP.  Shortness of breath with productive cough that is unchanged.  LVEF 60 to 65% on recent echocardiogram in 02/2023.  She is also previously followed with pulmonary and underwent pulmonary function testing unfortunately did not show obstruction and is not suggestive of COPD.  She has been encouraged to continue with her pulmonary follow-up.  Tobacco abuse which she has been counseled on the importance of smoking cessation.  Patient states that she continues to try to quit and she benefits mostly from the gum which she is using at home.  She states her last cigarette was yesterday.  Chronic lymphedema which she is swelling off and on to the bilateral lower extremities.  She continues to wear compression stockings today.  She was recently evaluated by [facility] and had an ultrasound completed.  She has been encouraged to continue with  conservative therapy.  It was recommended a lymphedema pump at her recent visit with [facility].       Dispo: Patient to return to clinic to see MD/APP in 3 to 4 weeks or sooner if needed for reevaluation of blood pressure and current medication changes made today.  Signed, Carsyn Boster, NP

## 2023-09-18 ENCOUNTER — Encounter: Payer: Self-pay | Admitting: Cardiology

## 2023-09-18 ENCOUNTER — Ambulatory Visit: Payer: Medicare Other | Attending: Cardiology | Admitting: Cardiology

## 2023-09-18 VITALS — BP 126/69 | HR 57 | Ht 60.0 in | Wt 172.0 lb

## 2023-09-18 DIAGNOSIS — I2510 Atherosclerotic heart disease of native coronary artery without angina pectoris: Secondary | ICD-10-CM | POA: Insufficient documentation

## 2023-09-18 DIAGNOSIS — R0602 Shortness of breath: Secondary | ICD-10-CM | POA: Diagnosis present

## 2023-09-18 DIAGNOSIS — I890 Lymphedema, not elsewhere classified: Secondary | ICD-10-CM | POA: Insufficient documentation

## 2023-09-18 DIAGNOSIS — E785 Hyperlipidemia, unspecified: Secondary | ICD-10-CM | POA: Insufficient documentation

## 2023-09-18 DIAGNOSIS — I10 Essential (primary) hypertension: Secondary | ICD-10-CM | POA: Insufficient documentation

## 2023-09-18 DIAGNOSIS — Z720 Tobacco use: Secondary | ICD-10-CM | POA: Diagnosis present

## 2023-09-18 NOTE — Patient Instructions (Signed)
Medication Instructions:  Your physician recommends that you continue on your current medications as directed. Please refer to the Current Medication list given to you today.  *If you need a refill on your cardiac medications before your next appointment, please call your pharmacy*  Lab Work: -None ordered  Testing/Procedures: -None ordered  Follow-Up: At [facility], you and your health needs are our priority.  As part of our continuing mission to provide you with exceptional heart care, we have created designated Provider Care Teams.  These Care Teams include your primary Cardiologist (physician) and Advanced Practice Providers (APPs -  Physician Assistants and Nurse Practitioners) who all work together to provide you with the care you need, when you need it.  Your next appointment:   4 week(s)  Provider:   Charlsie Quest, NP    Other Instructions -None

## 2023-10-23 ENCOUNTER — Encounter: Payer: Self-pay | Admitting: Cardiology

## 2023-10-23 ENCOUNTER — Ambulatory Visit: Payer: Medicare Other | Attending: Cardiology | Admitting: Cardiology

## 2023-10-23 VITALS — BP 120/64 | HR 69 | Ht 60.0 in | Wt 180.5 lb

## 2023-10-23 DIAGNOSIS — R0602 Shortness of breath: Secondary | ICD-10-CM | POA: Insufficient documentation

## 2023-10-23 DIAGNOSIS — I10 Essential (primary) hypertension: Secondary | ICD-10-CM | POA: Diagnosis present

## 2023-10-23 DIAGNOSIS — Z720 Tobacco use: Secondary | ICD-10-CM | POA: Diagnosis present

## 2023-10-23 DIAGNOSIS — I890 Lymphedema, not elsewhere classified: Secondary | ICD-10-CM | POA: Insufficient documentation

## 2023-10-23 DIAGNOSIS — E785 Hyperlipidemia, unspecified: Secondary | ICD-10-CM | POA: Diagnosis present

## 2023-10-23 DIAGNOSIS — I2510 Atherosclerotic heart disease of native coronary artery without angina pectoris: Secondary | ICD-10-CM | POA: Diagnosis present

## 2023-10-23 MED ORDER — ROSUVASTATIN CALCIUM 20 MG PO TABS: 20.0000 mg | ORAL_TABLET | Freq: Every day | ORAL | 3 refills | Status: AC

## 2023-10-23 NOTE — Progress Notes (Signed)
Cardiology Office Note:  .   Date:  10/23/2023  ID:  Madison Benson, DOB [birth year], MRN [id number] PCP: Jerl Mina, MD  [facility] Providers Cardiologist:  Maisie Fus, MD    History of Present Illness: .   Madison Benson is a 82 y.o. female with a past medical history of hyperlipidemia, hypertension, active smoking, shortness of breath, lymphedema, chronic back pain, who presents today for follow-up.  Patient was recently admitted in the hospital in February 2024 and discharged next day due to shortness of breath.  She had called EMS with sats that were noted to be 88% with increased work of breathing.  She was placed on 6 L of O2 via nasal cannula and route to the hospital.  Echocardiogram revealed a normal EF.  There was concern for COPD exacerbation.  Chest x-ray did not show edema.  CT of the chest showed severe CAC and severe bronchiectasis.  It was recommended she follow-up with pulmonary and she had pending PFTs with Dr.Dgayli.  She was last seen in clinic 09/18/2023.  She had been keeping a log of her blood pressures at home which had been taking her medications.  She has chronic shortness of breath with congestion but occasionally productive cough that is unchanged.  Her lymphedema was unchanged.  She did continue to have regular follow-ups with [facility].  There were variations in her blood pressure likely related to when she was taking her pain medication.  Timing of her medications was changed and she was encouraged to continue to keep a blood pressure log with close follow-up.  She returns to clinic today stating that overall she has been overlapping with a cardiac perspective.  She continues to suffer from fatigue and occasional sleepiness but her blood pressure fluctuating has improved.  She continued to keep a blood pressure log since her last appointment.  Unfortunately physical therapy was in the home yesterday when her blood pressure had dropped into the 70s.   Due to fluctuating blood pressures medications had been moved around and losartan was cut in half with her blood pressure dropping into the 70s losartan was ultimately discontinued.  She states that previously she been on fentanyl for an extended amount of time and since weaning off of fentanyl her blood pressures have been out of control.  She continues to follow with pain clinic.  She also continues to follow with [facility] for her lymphedema.  Denies any hospitalizations or visits to the emergency department.  ROS: 10 point review of systems has been reviewed and considered negative with exception what is been listed in the HPI  Studies Reviewed: Marland Kitchen        TTE 02/26/23 1. Left ventricular ejection fraction, by estimation, is 60 to 65%. The  left ventricle has normal function. The left ventricle has no regional  wall motion abnormalities. There is moderate left ventricular hypertrophy.  Left ventricular diastolic  parameters are consistent with Grade I diastolic dysfunction (impaired  relaxation).   2. Right ventricular systolic function is normal. The right ventricular  size is normal.   3. The mitral valve is normal in structure. Mild mitral valve  regurgitation. No evidence of mitral stenosis.   4. The aortic valve is normal in structure. Aortic valve regurgitation is  mild. No aortic stenosis is present.   5. The inferior vena cava is normal in size with greater than 50%  respiratory variability, suggesting right atrial pressure of 3 mmHg.  Risk Assessment/Calculations:  Physical Exam:   VS:  BP 120/64 (BP Location: Left Arm, Patient Position: Sitting, Cuff Size: Large)   Pulse 69   Ht 5' (1.524 m)   Wt 180 lb 8 oz (81.9 kg)   SpO2 95%   BMI 35.25 kg/m    Wt Readings from Last 3 Encounters:  10/23/23 180 lb 8 oz (81.9 kg)  09/18/23 172 lb (78 kg)  05/19/23 173 lb (78.5 kg)    GEN: Well nourished, well developed in no acute distress NECK: No JVD; No carotid  bruits CARDIAC: RRR, no murmurs, rubs, gallops RESPIRATORY:  Clear to auscultation without rales, wheezing or rhonchi  ABDOMEN: Soft, non-tender, non-distended EXTREMITIES: Trace pretibial edema; No deformity   ASSESSMENT AND PLAN: .   Primary hypertension with a blood pressure today of 120/64.  According to blood pressure logs blood pressure has been fairly stable but started fluctuating with physical therapy earlier in the week.  She had a blood pressure job of approximately 70 systolic yesterday prior to working with physical therapy.  According to her log her blood pressures have been up to as high as 140s and 150s.  She had dropped her losartan to 25 mg daily and with the continued drop in her blood pressure noted at home she had stopped taking the losartan altogether.  Today she comes in without any losartan and is only continued on Norvasc 5 mg at bedtime.  She has been encouraged to keep her losartan for blood pressure greater than 140/70 to take 25 mg.  She is also to continue to keep a blood pressure log and monitor her blood pressure 1 to 2 hours postmedication administration.  Coronary artery calcifications noted on CT scan seen more predominantly in the region of the LAD.  Recommended for preventative strategies.  Patient continues to deny any chest discomfort.  She is continued on aspirin 81 mg daily rosuvastatin 20 mg daily.  Mixed hyperlipidemia with an LDL of 67.  She is continued on 20 mg of rosuvastatin with a refill requested today sent to [facility] per patient's request.  Shortness of breath and dyspnea on exertion has been stable.  Recent echocardiogram in 02/2023 revealed EF 60 to 65%.  She was previously being followed by pulmonary and underwent pulmonary function testing unfortunately did not show obstruction and is not suggestive of COPD.  She continues to be encouraged to continue with her follow-ups with pulmonary.  Chronic lymphedema which she is swelling off and on  to her bilateral lower extremities.  She continues to wear compression stockings.  She also continues to be followed by [facility].  Tobacco abuse with she continues to be counseled on smoking cessation.  She states she continues to quit benefits mostly from the gum that she has been using home.  States that she has not had a cigarette in several days.       Dispo: Patient to return to clinic to see MD/APP in 3 months or sooner if needed for reevaluation of symptoms.  Signed, Aleczander Fandino, NP

## 2023-10-23 NOTE — Patient Instructions (Signed)
Medication Instructions:  Your physician recommends the following medication changes.   START TAKING: Crestor 20 mg daily   *If you need a refill on your cardiac medications before your next appointment, please call your pharmacy*   Lab Work: None ordered If you have labs (blood work) drawn today and your tests are completely normal, you will receive your results only by: MyChart Message (if you have MyChart) OR A paper copy in the mail If you have any lab test that is abnormal or we need to change your treatment, we will call you to review the results.   Testing/Procedures: None ordered   Follow-Up: At [facility], you and your health needs are our priority.  As part of our continuing mission to provide you with exceptional heart care, we have created designated Provider Care Teams.  These Care Teams include your primary Cardiologist (physician) and Advanced Practice Providers (APPs -  Physician Assistants and Nurse Practitioners) who all work together to provide you with the care you need, when you need it.  We recommend signing up for the patient portal called "MyChart".  Sign up information is provided on this After Visit Summary.  MyChart is used to connect with patients for Virtual Visits (Telemedicine).  Patients are able to view lab/test results, encounter notes, upcoming appointments, etc.  Non-urgent messages can be sent to your provider as well.   To learn more about what you can do with MyChart, go to [email or web address].    Your next appointment:   3 month(s)  Provider:   You may see Charlsie Quest, NP

## 2023-10-28 ENCOUNTER — Ambulatory Visit (INDEPENDENT_AMBULATORY_CARE_PROVIDER_SITE_OTHER): Payer: Medicare Other | Admitting: Vascular Surgery

## 2023-12-09 ENCOUNTER — Encounter (INDEPENDENT_AMBULATORY_CARE_PROVIDER_SITE_OTHER): Payer: Self-pay | Admitting: Vascular Surgery

## 2023-12-09 ENCOUNTER — Ambulatory Visit (INDEPENDENT_AMBULATORY_CARE_PROVIDER_SITE_OTHER): Payer: Medicare Other | Admitting: Vascular Surgery

## 2023-12-09 VITALS — BP 155/83 | HR 74 | Resp 18 | Ht 60.0 in | Wt 172.6 lb

## 2023-12-09 DIAGNOSIS — I10 Essential (primary) hypertension: Secondary | ICD-10-CM | POA: Diagnosis not present

## 2023-12-09 DIAGNOSIS — E785 Hyperlipidemia, unspecified: Secondary | ICD-10-CM

## 2023-12-09 DIAGNOSIS — N183 Chronic kidney disease, stage 3 unspecified: Secondary | ICD-10-CM

## 2023-12-09 DIAGNOSIS — I890 Lymphedema, not elsewhere classified: Secondary | ICD-10-CM | POA: Diagnosis not present

## 2023-12-09 NOTE — Progress Notes (Signed)
MRN : [id number]  Madison Benson is a 82 y.o. ([birth year]) female who presents with chief complaint of  Chief Complaint  Patient presents with   Follow-up    3-4 month follow up no studies  .  History of Present Illness: Patient returns today in follow up of her lymphedema and leg swelling.  She has gotten her pump and has been using this intermittently with good results.  She is also started taking a lymph MD supplement.  Her mobility remains quite poor from her debilitating arthritis.  Her left leg really is not swollen at all but her right leg continues to have some swelling.  She says it waxes and wanes in terms of its severity but it has been better since starting her supplement and using the pumps.  She does wear compression socks daily.  Current Outpatient Medications  Medication Sig Dispense Refill   albuterol (VENTOLIN HFA) 108 (90 Base) MCG/ACT inhaler Inhale 1-2 puffs into the lungs every 4 (four) hours as needed. 8 g 0   amLODipine (NORVASC) 2.5 MG tablet Take 5 mg by mouth at bedtime.     aspirin 81 MG EC tablet Take 81 mg by mouth daily.     Calcium Carb-Cholecalciferol (OYSTER SHELL CALCIUM W/D) 500-5 MG-MCG TABS Take by mouth.     Calcium-Vitamin D-Vitamin K 750-500-40 MG-UNT-MCG TABS Take 1 tablet by mouth daily.     cholecalciferol (VITAMIN D) 1000 units tablet Take 1,000 Units by mouth 2 (two) times daily.     cyanocobalamin (VITAMIN B12) 1000 MCG tablet Take 1,000 mcg by mouth daily.     DULoxetine (CYMBALTA) 60 MG capsule Take 60 mg by mouth at bedtime.     Ferrous Sulfate Dried 45 MG TBCR Take 45 mg by mouth daily.     Multiple Vitamins-Minerals (PRESERVISION AREDS 2 PO) Take 1 tablet by mouth 2 (two) times daily.     Omega-3 Fatty Acids (FISH OIL) 1200 MG CAPS Take 1 capsule by mouth daily after breakfast.     omeprazole (PRILOSEC) 20 MG capsule Take 20 mg by mouth daily.     Oxycodone HCl 10 MG TABS Take 10 mg by mouth 3 (three) times daily.     polyethylene  glycol (MIRALAX / GLYCOLAX) 17 g packet Take 17 g by mouth daily.     rosuvastatin (CRESTOR) 20 MG tablet Take 1 tablet (20 mg total) by mouth daily. 90 tablet 3   solifenacin (VESICARE) 5 MG tablet Take 5 mg by mouth daily.     tiZANidine (ZANAFLEX) 4 MG tablet Take 4 mg by mouth 2 (two) times daily.     Turmeric (QC TUMERIC COMPLEX PO) Take by mouth.     losartan (COZAAR) 50 MG tablet Take 50 mg by mouth daily. (Patient not taking: Reported on 10/23/2023)     No current facility-administered medications for this visit.    Past Medical History:  Diagnosis Date   Chronic back pain    HLD (hyperlipidemia)    Hypertension    Lymphedema    Smoker     Past Surgical History:  Procedure Laterality Date   BACK SURGERY       Social History   Tobacco Use   Smoking status: Every Day    Current packs/day: 0.50    Average packs/day: 0.5 packs/day for 64.0 years (32.0 ttl pk-yrs)    Types: Cigarettes   Smokeless tobacco: Never   Tobacco comments:    7 cigarettes daily- 05/19/23  Vaping Use  Vaping status: Never Used  Substance Use Topics   Alcohol use: Yes    Alcohol/week: 5.0 standard drinks of alcohol    Types: 5 Glasses of wine per week   Drug use: No      Family History  Problem Relation Age of Onset   Hypertension Other      Allergies  Allergen Reactions   Latex Rash    After band aid left for couple days After band aid left for couple days After band aid left for couple days    Nsaids Diarrhea and Nausea And Vomiting   Sulfa Antibiotics Diarrhea and Nausea And Vomiting   Ciprofloxacin Itching   Diclofenac Sodium Diarrhea   Naproxen Diarrhea    Any anti-inflammatory causes severe diarrhea Any anti-inflammatory causes severe diarrhea Any anti-inflammatory causes severe diarrhea      REVIEW OF SYSTEMS (Negative unless checked)   Constitutional: [] Weight loss  [] Fever  [] Chills Cardiac: [] Chest pain   [] Chest pressure   [] Palpitations   [] Shortness of  breath when laying flat   [] Shortness of breath at rest   [] Shortness of breath with exertion. Vascular:  [] Pain in legs with walking   [] Pain in legs at rest   [] Pain in legs when laying flat   [] Claudication   [] Pain in feet when walking  [] Pain in feet at rest  [] Pain in feet when laying flat   [] History of DVT   [] Phlebitis   [x] Swelling in legs   [] Varicose veins   [] Non-healing ulcers Pulmonary:   [] Uses home oxygen   [] Productive cough   [] Hemoptysis   [] Wheeze  [] COPD   [] Asthma Neurologic:  [] Dizziness  [] Blackouts   [] Seizures   [] History of stroke   [] History of TIA  [] Aphasia   [] Temporary blindness   [] Dysphagia   [] Weakness or numbness in arms   [] Weakness or numbness in legs Musculoskeletal:  [x] Arthritis   [] Joint swelling   [x] Joint pain   [x] Low back pain Hematologic:  [] Easy bruising  [] Easy bleeding   [] Hypercoagulable state   [] Anemic   Gastrointestinal:  [] Blood in stool   [] Vomiting blood  [] Gastroesophageal reflux/heartburn   [] Abdominal pain Genitourinary:  [] Chronic kidney disease   [] Difficult urination  [] Frequent urination  [] Burning with urination   [] Hematuria Skin:  [] Rashes   [] Ulcers   [] Wounds Psychological:  [] History of anxiety   []  History of major depression.  Physical Examination  BP (!) 155/83 (BP Location: Left Arm)   Pulse 74   Resp 18   Ht 5' (1.524 m)   Wt 172 lb 9.6 oz (78.3 kg)   BMI 33.71 kg/m  Gen:  WD/WN, NAD Head: [place]/AT, No temporalis wasting. Ear/Nose/Throat: Hearing grossly intact, nares w/o erythema or drainage Eyes: Conjunctiva clear. Sclera non-icteric Neck: Supple.  Trachea midline Pulmonary:  Good air movement, no use of accessory muscles.  Cardiac: RRR, no JVD Vascular:  Vessel Right Left  Radial Palpable Palpable               Musculoskeletal: M/S 5/5 throughout.  Arc arthritic deformities worse on the right leg than the left.  1+ RLE edema, No LLE edema.  Neurologic: Sensation grossly intact in extremities.  Symmetrical.   Speech is fluent.  Psychiatric: Judgment intact, Mood & affect appropriate for pt's clinical situation. Dermatologic: No rashes or ulcers noted.  No cellulitis or open wounds.      Labs No results found for this or any previous visit (from the past 2160 hour(s)).  Radiology No results  found.  Assessment/Plan  Lymphedema Lymphedema is currently under pretty good control.  She will continue her compression, elevation, and her lymphedema pump.  I will plan to see her back in 6 months.  HLD (hyperlipidemia) lipid control important in reducing the progression of atherosclerotic disease. Continue statin therapy     CKD (chronic kidney disease) stage 3, GFR 30-59 ml/min (HCC) Can certainly worsen LE edema.     HTN (hypertension) blood pressure control important in reducing the progression of atherosclerotic disease. On appropriate oral medications.    Festus Barren, MD  12/09/2023 11:50 AM    This note was created with Dragon medical transcription system.  Any errors from dictation are purely unintentional

## 2023-12-09 NOTE — Assessment & Plan Note (Signed)
Lymphedema is currently under pretty good control.  She will continue her compression, elevation, and her lymphedema pump.  I will plan to see her back in 6 months.

## 2024-01-27 ENCOUNTER — Ambulatory Visit: Payer: Medicare Other | Attending: Cardiology | Admitting: Cardiology

## 2024-01-27 ENCOUNTER — Encounter: Payer: Self-pay | Admitting: Cardiology

## 2024-01-27 VITALS — BP 120/74 | HR 66 | Ht 60.0 in | Wt 175.8 lb

## 2024-01-27 DIAGNOSIS — Z720 Tobacco use: Secondary | ICD-10-CM

## 2024-01-27 DIAGNOSIS — I890 Lymphedema, not elsewhere classified: Secondary | ICD-10-CM | POA: Diagnosis present

## 2024-01-27 DIAGNOSIS — E785 Hyperlipidemia, unspecified: Secondary | ICD-10-CM

## 2024-01-27 DIAGNOSIS — I2510 Atherosclerotic heart disease of native coronary artery without angina pectoris: Secondary | ICD-10-CM | POA: Diagnosis not present

## 2024-01-27 DIAGNOSIS — R0602 Shortness of breath: Secondary | ICD-10-CM

## 2024-01-27 DIAGNOSIS — I10 Essential (primary) hypertension: Secondary | ICD-10-CM

## 2024-01-27 NOTE — Progress Notes (Signed)
Cardiology Office Note:  .   Date:  01/27/2024  ID:  Madison Benson, DOB 1941-04-24, MRN [id number] PCP: Jerl Mina, MD  [facility] Providers Cardiologist:  Maisie Fus, MD    History of Present Illness: .   Madison Benson is a 83 y.o. female with a past medical history of hyperlipidemia, hypertension, active smoking, continued shortness of breath, lymphedema, chronic back pain, who presents today for follow-up.   She previously admitted to the hospital February 2020 for discharge Due to shortness of breath.  She called EMS with sats noted to be 88% with increased work of breathing.  She was placed on 6 L of O2 via nasal cannula and went to the hospital.  Echocardiogram revealed normal EF with concern for COPD exacerbation.  Chest x-ray did not show edema.  CT of the chest showed severe CAC with severe bronchiectasis.  It was recommended she follow-up with pulmonary and she had pending PFTs with Dr. Aundria Rud.  Her visit to clinic 09/18/2023 she became a lot of her blood pressures at home which she had been taking her medications.  Chronic shortness of breath and congestion with occasional productive cough that is unchanged.  Lymphedema is unchanged.  She continued to have regular follow-ups with [facility].   She was last seen in clinic 10/19/2023 stating that overall she has been doing well from a cardiac perspective.  She continues to suffer from some fatigue and shortness of breath her blood pressures fluctuating had improved.  She continues to keep her blood pressure log.  Unfortunately physical therapy was not home yesterday when her blood pressure dropped into the 70s due to fluctuating blood pressures medications (around losartan was cut in half as her blood pressure treatment several losartan was ultimately discontinued.  It was thought to be likely combination of her pain medication as well as blood pressure medication.  Went over the medication changes that would need to be  made and further testing that was ordered.  She returns to clinic today stating that overall she has been doing well up until the last 3 weeks. She has noted a drop in her blood pressure where she brought in her Bp log today. Last week she had been a low as 81/54. She states that several days physical therapy has been unable to work with her in the home due to falling asleep. At her last visit her blood pressure had been liable and medication changes were made. She denies any chest pain, palpitations, for worsening shortness of breath. Her dyspnea is chronic. She notes with lower blood pressure she has been lightheaded and had some dizziness.  Previously she had been advised to take her losartan if her blood pressure was greater than 140/80 and she had decided to go over 150/80 to prevent further drops in her pressure.  She notes that the drops in her blood pressure-1 to 2 hours after she takes her pain medication muscle relaxers and a large quantity of supplemental medications.  She denies any hospitalizations or visits to the emergency department.  ROS: 10 point review of systems has been reviewed and considered negative except what is been listed in HPI  Studies Reviewed: Marland Kitchen       TTE 02/26/23 1. Left ventricular ejection fraction, by estimation, is 60 to 65%. The  left ventricle has normal function. The left ventricle has no regional  wall motion abnormalities. There is moderate left ventricular hypertrophy.  Left ventricular diastolic  parameters are consistent with Grade I  diastolic dysfunction (impaired  relaxation).   2. Right ventricular systolic function is normal. The right ventricular  size is normal.   3. The mitral valve is normal in structure. Mild mitral valve  regurgitation. No evidence of mitral stenosis.   4. The aortic valve is normal in structure. Aortic valve regurgitation is  mild. No aortic stenosis is present.   5. The inferior vena cava is normal in size with greater than  50%  respiratory variability, suggesting right atrial pressure of 3 mmHg.  Risk Assessment/Calculations:             Physical Exam:   VS:  BP 120/74   Pulse 66   Ht 5' (1.524 m)   Wt 175 lb 12.8 oz (79.7 kg)   SpO2 98%   BMI 34.33 kg/m    Wt Readings from Last 3 Encounters:  01/27/24 175 lb 12.8 oz (79.7 kg)  12/09/23 172 lb 9.6 oz (78.3 kg)  10/23/23 180 lb 8 oz (81.9 kg)    GEN: Well nourished, well developed in no acute distress NECK: No JVD; No carotid bruits CARDIAC: RRR, no murmurs, rubs, gallops RESPIRATORY:  Clear to auscultation without rales, wheezing or rhonchi  ABDOMEN: Soft, non-tender, non-distended EXTREMITIES:  Trace per edema; No deformity   ASSESSMENT AND PLAN: .   Primary hypertension with bouts of orthostatic hypotension.  Patient is continue to keep a blood pressure log at home which has been fairly stable until the last several weeks where she has been dropping into the 80s systolic.  Previously she had been advised to continue her Norvasc 2.5 mg at bedtime and take her losartan if her blood pressure was greater than 140/70.  She had been waiting 12 pressure was around 150/80 prior to taking losartan 25 mg in the morning and had continued to take losartan 25 mg in the evening. She continues to have drops in her blood pressure with lightheadedness and dizziness causing her to be unable to participate with physical therapy.  Upon further review of all the medications that she takes in the morning or muscle relaxers, narcotics, and supplements.  Blood pressure drops 1 to 2 hours from 130-140 down to the 70s and 80s.  Patient has been encouraged to continue to keep a blood pressure log and take her blood pressure 1 to 2 hours postmedication administration.  She has been advised to only take losartan as needed in the morning and evening if her blood pressure is 150/80 or higher. She has also been encouraged to follow-up with her primary care provider about possibly  reducing the dose of her muscle relaxer as with her drops in her pressure she has not had any antihypertensive medications.  Patient has also been asked to take her blood pressure cuff to clinic with her to have correlation to determine accuracy of her manual blood pressure versus her automated blood pressure cuff.  Coronary calcifications noted on CT scan seen more prominently region LAD.  She was recommended for prevention strategies.  She is continued on aspirin 81 mg daily and rosuvastatin 20 mg daily.  Mixed hyperlipidemia with LDL 67.  He is continued on 20 mg of rosuvastatin.  States that she has upcoming lab ups with her primary care provider.  Chronic shortness of breath and dyspnea on exertion with continued tobacco abuse.  Echocardiogram completed 02/2023 revealed LVEF 60 to 65%.  She had previously been followed by pulmonary and underwent pulmonary function testing unfortunately did not show obstruction and would like  suggestive of COPD.  Likely could be a component of deconditioning as well though she is not as mobile as she had previously been. She is encouraged to continue to maintain her follow-ups.  No further testing is needed at this time as symptoms have not changed.  Total cessation is recommended.  Chronic lymphedema which is swelling off-and-on to her bilateral lower extremities she continues to wear compression stockings she has not been able to use her lymphedema pumps as frequently as she has noted a cognitive decline and her husband that he has who helps her better problems on.  She does continue to follow with [facility].       Dispo: Patient return to clinic to see MD/APP in 6 weeks or sooner if needed for reevaluation of blood pressure.  Signed, Allianna Beaubien, NP

## 2024-01-27 NOTE — Patient Instructions (Signed)
Medication Instructions:   Your physician has recommended you make the following change in your medication:   Take Losartan 50 mg tablet- Take 1 tablet by mouth as needed for Blood pressure greater than 150/80.  *If you need a refill on your cardiac medications before your next appointment, please call your pharmacy*   Lab Work:  NONE TODAY  If you have labs (blood work) drawn today and your tests are completely normal, you will receive your results only by: MyChart Message (if you have MyChart) OR A paper copy in the mail If you have any lab test that is abnormal or we need to change your treatment, we will call you to review the results.   Testing/Procedures:  NONE TODAY   Follow-Up: At [facility], you and your health needs are our priority.  As part of our continuing mission to provide you with exceptional heart care, we have created designated Provider Care Teams.  These Care Teams include your primary Cardiologist (physician) and Advanced Practice Providers (APPs -  Physician Assistants and Nurse Practitioners) who all work together to provide you with the care you need, when you need it.  We recommend signing up for the patient portal called "MyChart".  Sign up information is provided on this After Visit Summary.  MyChart is used to connect with patients for Virtual Visits (Telemedicine).  Patients are able to view lab/test results, encounter notes, upcoming appointments, etc.  Non-urgent messages can be sent to your provider as well.   To learn more about what you can do with MyChart, go to [email or web address].    Your next appointment:   6 week(s)  Provider:   You may see Maisie Fus, MD or one of the following Advanced Practice Providers on your designated Care Team:   Nicolasa Ducking, NP Eula Listen, PA-C Cadence Fransico Michael, PA-C Charlsie Quest, NP Carlos Levering, NP

## 2024-03-23 ENCOUNTER — Encounter: Payer: Self-pay | Admitting: Cardiology

## 2024-03-23 ENCOUNTER — Ambulatory Visit: Payer: Medicare Other | Attending: Cardiology | Admitting: Cardiology

## 2024-03-23 VITALS — BP 140/70 | HR 72 | Ht 60.0 in | Wt 178.0 lb

## 2024-03-23 DIAGNOSIS — I2510 Atherosclerotic heart disease of native coronary artery without angina pectoris: Secondary | ICD-10-CM | POA: Diagnosis not present

## 2024-03-23 DIAGNOSIS — I890 Lymphedema, not elsewhere classified: Secondary | ICD-10-CM | POA: Diagnosis not present

## 2024-03-23 DIAGNOSIS — Z720 Tobacco use: Secondary | ICD-10-CM | POA: Diagnosis present

## 2024-03-23 DIAGNOSIS — E785 Hyperlipidemia, unspecified: Secondary | ICD-10-CM | POA: Insufficient documentation

## 2024-03-23 DIAGNOSIS — I10 Essential (primary) hypertension: Secondary | ICD-10-CM | POA: Diagnosis not present

## 2024-03-23 DIAGNOSIS — R0602 Shortness of breath: Secondary | ICD-10-CM | POA: Diagnosis present

## 2024-03-23 NOTE — Patient Instructions (Addendum)
  Medication Instructions:  Your physician recommends that you continue on your current medications as directed. Please refer to the Current Medication list given to you today.  *If you need a refill on your cardiac medications before your next appointment, please call your pharmacy*   Lab Work: No labs ordered today  If you have labs (blood work) drawn today and your tests are completely normal, you will receive your results only by: MyChart Message (if you have MyChart) OR A paper copy in the mail If you have any lab test that is abnormal or we need to change your treatment, we will call you to review the results.   Testing/Procedures: No test ordered today    Follow-Up: At [facility], you and your health needs are our priority.  As part of our continuing mission to provide you with exceptional heart care, we have created designated Provider Care Teams.  These Care Teams include your primary Cardiologist (physician) and Advanced Practice Providers (APPs -  Physician Assistants and Nurse Practitioners) who all work together to provide you with the care you need, when you need it.    Your next appointment:   3 month(s)  Provider:   You may see Maisie Fus, MD or one of the following Advanced Practice Providers on your designated Care Team:   Nicolasa Ducking, NP Eula Listen, PA-C Cadence Fransico Michael, PA-C Charlsie Quest, NP Carlos Levering, NP

## 2024-03-23 NOTE — Progress Notes (Signed)
 Cardiology Office Note:  .   Date:  03/23/2024  ID:  Whittley Carandang, DOB [birth year], MRN [id number] PCP: Jerl Mina, MD  [facility] Providers Cardiologist:  Maisie Fus, MD    History of Present Illness: .   Madison Benson is a 83 y.o. female with a past medical history of hyperlipidemia, hypertension, coronary artery calcification (seen on CT scan), active smoker, and chronic shortness of breath, lymphedema, chronic back pain, who presents today for follow-up of her hypertension and hyperlipidemia.   She previously admitted to the hospital February 2020 for discharge Due to shortness of breath.  She called EMS with sats noted to be 88% with increased work of breathing.  She was placed on 6 L of O2 via nasal cannula and went to the hospital.  Echocardiogram revealed normal EF with concern for COPD exacerbation.  Chest x-ray did not show edema.  CT of the chest showed severe CAC with severe bronchiectasis.  It was recommended she follow-up with pulmonary and she had pending PFTs with Dr. Aundria Rud.  Her visit to clinic 09/18/2023 she became a lot of her blood pressures at home which she had been taking her medications.  Chronic shortness of breath and congestion with occasional productive cough that is unchanged.  Lymphedema is unchanged.  She continued to have regular follow-ups with [facility].   She was last seen in clinic 01/27/2024 stating that she doing well up until approximately 3 weeks prior.  She noted a drop in her blood pressure when she brought her blood pressure log in today.  Physical therapy, and her blood pressure dropped as low as 81/54.  She denied any other symptoms.  Time adjustments were made on her current medication regimen and she was to keep a blood pressure log and return in several weeks for further evaluation of her blood pressure.  She returns to clinic today stating that she has been doing well.  States that her blood pressure medications and medication  regimen were changed at her last appointment and blood pressure has been well-controlled.  She has not had huge fluctuations of her blood pressure going from 170 and 180 to 80 systolic.  She states that her daughter recently was visiting and became ill but she and her husband have done well.  She states that she has been compliant with her current medication regimen without any adverse side effects.  Denies any chest pain or shortness of breath.  Occasionally has peripheral edema and she has lymphedema.  She also has arthritis and swelling to the left leg which has been ongoing for approximately a year.  She denies any hospitalizations or visits to the emergency department.  ROS: 10 point review of system has been reviewed and considered negative exception was been listed in the HPI  Studies Reviewed: Marland Kitchen        TTE 02/26/23 1. Left ventricular ejection fraction, by estimation, is 60 to 65%. The  left ventricle has normal function. The left ventricle has no regional  wall motion abnormalities. There is moderate left ventricular hypertrophy.  Left ventricular diastolic  parameters are consistent with Grade I diastolic dysfunction (impaired  relaxation).   2. Right ventricular systolic function is normal. The right ventricular  size is normal.   3. The mitral valve is normal in structure. Mild mitral valve  regurgitation. No evidence of mitral stenosis.   4. The aortic valve is normal in structure. Aortic valve regurgitation is  mild. No aortic stenosis is present.  5. The inferior vena cava is normal in size with greater than 50%  respiratory variability, suggesting right atrial pressure of 3 mmHg.  Risk Assessment/Calculations:        Physical Exam:   VS:  BP (!) 140/70   Pulse 72   Ht 5' (1.524 m)   Wt 178 lb (80.7 kg)   SpO2 96%   BMI 34.76 kg/m    Wt Readings from Last 3 Encounters:  03/23/24 178 lb (80.7 kg)  01/27/24 175 lb 12.8 oz (79.7 kg)  12/09/23 172 lb 9.6 oz (78.3 kg)     GEN: Well nourished, well developed in no acute distress NECK: No JVD; No carotid bruits CARDIAC: RRR, no murmurs, rubs, gallops RESPIRATORY:  Clear to auscultation without rales, wheezing or rhonchi  ABDOMEN: Soft, non-tender, non-distended EXTREMITIES: Trace pretibial edema greater on the right than the left; venous discoloration noted to bilateral lower extremities no deformity   ASSESSMENT AND PLAN: .   Primary hypertension with bouts of orthostatic hypotension.  Since recent medication changes have been made blood pressure has been more stable without swings in her blood pressure.  Blood pressure today 140/79 prior to arrival 130/72.  She has been continued on amlodipine 2.5 mg at bedtime and to take her losartan for blood pressures greater than 140/70.  There have been no more recommendations of physical therapy and her unable to participate with dizziness or lightheadedness since changing her blood pressure medications.  She is also taking muscle relaxers and her narcotics separately to prevent orthostasis.  With improvement in her blood pressure she has been encouraged to continue to monitor at home 1 to 2 hours postmedication administration.  Coronary calcifications noted on CT scan see more prominent in the LAD region.  She was recommended for prevention strategies.  She is continued on aspirin 81 mg daily rosuvastatin 20 mg daily.  Mixed hyperlipidemia with last LDL of 67. She is continued on 20 mg of rosuvastatin.  She has upcoming labs with her PCP.  Chronic shortness of breath that has not worsened.  Last echocardiogram completed 01/2023 revealed LVEF of 60 to 65%.  Is likely multifactorial and a component of deconditioning as she is not as mobile due to right ankle and knee discomfort.   Continued tobacco use with total cessation recommended.  Chronic lymphedema which she has venous stasis discoloration to bilateral lower extremities.  There is only trace edema today.  She does  continue to follow with [facility].       Dispo: Patient return to clinic to see me/APP in 3 months or sooner if needed  Signed, Kasson Lamere, NP

## 2024-05-18 ENCOUNTER — Encounter (INDEPENDENT_AMBULATORY_CARE_PROVIDER_SITE_OTHER): Payer: Self-pay

## 2024-06-08 ENCOUNTER — Ambulatory Visit (INDEPENDENT_AMBULATORY_CARE_PROVIDER_SITE_OTHER): Payer: Medicare Other | Admitting: Vascular Surgery

## 2024-06-08 ENCOUNTER — Encounter (INDEPENDENT_AMBULATORY_CARE_PROVIDER_SITE_OTHER): Payer: Self-pay | Admitting: Vascular Surgery

## 2024-06-08 VITALS — BP 144/82 | HR 66 | Resp 18

## 2024-06-08 DIAGNOSIS — N183 Chronic kidney disease, stage 3 unspecified: Secondary | ICD-10-CM | POA: Diagnosis not present

## 2024-06-08 DIAGNOSIS — I10 Essential (primary) hypertension: Secondary | ICD-10-CM

## 2024-06-08 DIAGNOSIS — E785 Hyperlipidemia, unspecified: Secondary | ICD-10-CM

## 2024-06-08 DIAGNOSIS — I890 Lymphedema, not elsewhere classified: Secondary | ICD-10-CM

## 2024-06-08 NOTE — Assessment & Plan Note (Signed)
 Symptoms have worsened with worsening blood pressure control as well as warming of the temperature.  I have urged her to begin using her lymphedema pump again.  Continue wearing her compression socks and elevating.  Her arthritis is debilitating and limits her from activity.  Her legs are worse but she does not need Unna boots or other therapies at this time.  Follow-up in 6 months.

## 2024-06-08 NOTE — Progress Notes (Signed)
 MRN : [id number]  Madison Benson is a 83 y.o. (01/09/1941) female who presents with chief complaint of  Chief Complaint  Patient presents with   Follow-up    Follow up 6 months Lymphedema   .  History of Present Illness: Patient returns today in follow up of her lymphedema.  Since I saw her last, her legs have done quite well for about 5 months.  Over the past month or so, she developed worsening swings in her blood pressure associated with worsening lower extremity swelling.  This has gotten some better but is still more prominent than her last visit.  Her right lower extremity is more affected leg.  There is significant skin thickening and hyperpigmentation.  She has not been using her lymphedema pump regularly.  She has had to go to a larger size of compression socks.  Current Outpatient Medications  Medication Sig Dispense Refill   albuterol  (VENTOLIN  HFA) 108 (90 Base) MCG/ACT inhaler Inhale 1-2 puffs into the lungs every 4 (four) hours as needed. 8 g 0   amLODipine  (NORVASC ) 2.5 MG tablet Take 5 mg by mouth at bedtime.     aspirin  81 MG EC tablet Take 81 mg by mouth daily.     Calcium -Vitamin D -Vitamin K 750-500-40 MG-UNT-MCG TABS Take 1 tablet by mouth daily.     cholecalciferol  (VITAMIN D ) 1000 units tablet Take 1,000 Units by mouth 2 (two) times daily.     cyanocobalamin  (VITAMIN B12) 1000 MCG tablet Take 1,000 mcg by mouth daily.     DULoxetine  (CYMBALTA ) 60 MG capsule Take 60 mg by mouth at bedtime.     Ferrous Sulfate  Dried 45 MG TBCR Take 65 mg by mouth daily.     losartan  (COZAAR ) 50 MG tablet Take 50 mg by mouth as directed. Take 1 tablet by mouth as needed for BP >150/80.     Magnesium  200 MG TABS Take 200 mg by mouth every other day.     Omega-3 Fatty Acids (FISH OIL) 1200 MG CAPS Take 1 capsule by mouth daily after breakfast.     omeprazole (PRILOSEC) 20 MG capsule Take 20 mg by mouth daily.     Oxycodone  HCl 10 MG TABS Take 10 mg by mouth 3 (three) times daily.      polyethylene glycol (MIRALAX  / GLYCOLAX ) 17 g packet Take 17 g by mouth daily.     rosuvastatin  (CRESTOR ) 20 MG tablet Take 1 tablet (20 mg total) by mouth daily. 90 tablet 3   solifenacin (VESICARE) 5 MG tablet Take 5 mg by mouth every evening.     tiZANidine  (ZANAFLEX ) 4 MG tablet Take 4 mg by mouth 2 (two) times daily.     UNABLE TO FIND Take 2 capsules by mouth daily. Med Name: [facility] MD- Lymph MD     UNABLE TO FIND Take 1 capsule by mouth at bedtime. Med Name: Preprobiotic and probiotic combination pill     No current facility-administered medications for this visit.    Past Medical History:  Diagnosis Date   Chronic back pain    HLD (hyperlipidemia)    Hypertension    Lymphedema    Smoker     Past Surgical History:  Procedure Laterality Date   BACK SURGERY       Social History   Tobacco Use   Smoking status: Every Day    Current packs/day: 0.50    Average packs/day: 0.5 packs/day for 64.0 years (32.0 ttl pk-yrs)    Types: Cigarettes  Smokeless tobacco: Never   Tobacco comments:    7 cigarettes daily- 05/19/23  Vaping Use   Vaping status: Never Used  Substance Use Topics   Alcohol use: Yes    Alcohol/week: 5.0 standard drinks of alcohol    Types: 5 Glasses of wine per week   Drug use: No      Family History  Problem Relation Age of Onset   Heart attack Mother    Heart attack Father    Hypertension Other      Allergies  Allergen Reactions   Latex Rash    After band aid left for couple days After band aid left for couple days After band aid left for couple days    Nsaids Diarrhea and Nausea And Vomiting   Sulfamethoxazole Other (See Comments)    Other Reaction(s): abdominal pain   Sulfa Antibiotics Diarrhea and Nausea And Vomiting   Ciprofloxacin Itching   Diclofenac Sodium Diarrhea   Naproxen Diarrhea    Any anti-inflammatory causes severe diarrhea Any anti-inflammatory causes severe diarrhea Any anti-inflammatory causes severe  diarrhea      REVIEW OF SYSTEMS (Negative unless checked)   Constitutional: [] Weight loss  [] Fever  [] Chills Cardiac: [] Chest pain   [] Chest pressure   [] Palpitations   [] Shortness of breath when laying flat   [] Shortness of breath at rest   [] Shortness of breath with exertion. Vascular:  [] Pain in legs with walking   [] Pain in legs at rest   [] Pain in legs when laying flat   [] Claudication   [] Pain in feet when walking  [] Pain in feet at rest  [] Pain in feet when laying flat   [] History of DVT   [] Phlebitis   [x] Swelling in legs   [] Varicose veins   [] Non-healing ulcers Pulmonary:   [] Uses home oxygen   [] Productive cough   [] Hemoptysis   [] Wheeze  [] COPD   [] Asthma Neurologic:  [] Dizziness  [] Blackouts   [] Seizures   [] History of stroke   [] History of TIA  [] Aphasia   [] Temporary blindness   [] Dysphagia   [] Weakness or numbness in arms   [] Weakness or numbness in legs Musculoskeletal:  [x] Arthritis   [] Joint swelling   [x] Joint pain   [x] Low back pain Hematologic:  [] Easy bruising  [] Easy bleeding   [] Hypercoagulable state   [] Anemic   Gastrointestinal:  [] Blood in stool   [] Vomiting blood  [] Gastroesophageal reflux/heartburn   [] Abdominal pain Genitourinary:  [] Chronic kidney disease   [] Difficult urination  [] Frequent urination  [] Burning with urination   [] Hematuria Skin:  [] Rashes   [] Ulcers   [] Wounds Psychological:  [] History of anxiety   []  History of major depression.  Physical Examination  BP (!) 144/82 (BP Location: Left Arm, Patient Position: Sitting, Cuff Size: Normal)   Pulse 66   Resp 18  Gen:  WD/WN, NAD Head: [place]/AT, No temporalis wasting. Ear/Nose/Throat: Hearing grossly intact, nares w/o erythema or drainage Eyes: Conjunctiva clear. Sclera non-icteric Neck: Supple.  Trachea midline Pulmonary:  Good air movement, no use of accessory muscles.  Cardiac: RRR, no JVD Vascular:  Vessel Right Left  Radial Palpable Palpable           Musculoskeletal: M/S 5/5  throughout.  No deformity or atrophy.  Severe arthritic changes noted throughout multiple joints.  Kyphosis present.  2+ right lower extremity edema, 1+ left lower extremity edema.  Significant hyperpigmentation, stasis changes, and skin thickening are present bilaterally. Neurologic: Sensation grossly intact in extremities.  Symmetrical.  Speech is fluent.  Psychiatric: Judgment intact,  Mood & affect appropriate for pt's clinical situation. Dermatologic: No rashes or ulcers noted.  No cellulitis or open wounds.      Labs No results found for this or any previous visit (from the past 2160 hours).  Radiology No results found.  Assessment/Plan  Lymphedema Symptoms have worsened with worsening blood pressure control as well as warming of the temperature.  I have urged her to begin using her lymphedema pump again.  Continue wearing her compression socks and elevating.  Her arthritis is debilitating and limits her from activity.  Her legs are worse but she does not need Unna boots or other therapies at this time.  Follow-up in 6 months.  HLD (hyperlipidemia) lipid control important in reducing the progression of atherosclerotic disease. Continue statin therapy     CKD (chronic kidney disease) stage 3, GFR 30-59 ml/min (HCC) Can certainly worsen LE edema.     HTN (hypertension) blood pressure control important in reducing the progression of atherosclerotic disease. On appropriate oral medications.    Mikki Alexander, MD  06/08/2024 12:08 PM    This note was created with Dragon medical transcription system.  Any errors from dictation are purely unintentional

## 2024-06-23 ENCOUNTER — Ambulatory Visit: Attending: Cardiology | Admitting: Cardiology

## 2024-06-23 ENCOUNTER — Encounter: Payer: Self-pay | Admitting: Cardiology

## 2024-06-23 VITALS — BP 142/74 | HR 67 | Ht 64.0 in | Wt 174.0 lb

## 2024-06-23 DIAGNOSIS — R0602 Shortness of breath: Secondary | ICD-10-CM | POA: Insufficient documentation

## 2024-06-23 DIAGNOSIS — I10 Essential (primary) hypertension: Secondary | ICD-10-CM | POA: Insufficient documentation

## 2024-06-23 DIAGNOSIS — E785 Hyperlipidemia, unspecified: Secondary | ICD-10-CM | POA: Insufficient documentation

## 2024-06-23 DIAGNOSIS — R0789 Other chest pain: Secondary | ICD-10-CM | POA: Insufficient documentation

## 2024-06-23 DIAGNOSIS — I890 Lymphedema, not elsewhere classified: Secondary | ICD-10-CM | POA: Insufficient documentation

## 2024-06-23 DIAGNOSIS — I2510 Atherosclerotic heart disease of native coronary artery without angina pectoris: Secondary | ICD-10-CM | POA: Insufficient documentation

## 2024-06-23 DIAGNOSIS — Z720 Tobacco use: Secondary | ICD-10-CM | POA: Diagnosis present

## 2024-06-23 NOTE — Progress Notes (Signed)
 Cardiology Office Note   Date:  06/23/2024  ID:  Madison Benson, Madison Benson Oct 02, 1941, MRN [id number] PCP: Valora Agent, MD   [facility] Providers Cardiologist:  Alvan Ronal BRAVO, MD (Inactive)     History of Present Illness Madison Benson is a 83 y.o. female with a past medical history of hyperlipidemia, hypertension coronary calcification (seen on CT scan), active smoker, chronic shortness of breath, lymphedema, chronic back pain, who presents today for follow-up.   She previously admitted to the hospital February 2020 for discharge Due to shortness of breath.  She called EMS with sats noted to be 88% with increased work of breathing.  She was placed on 6 L of O2 via nasal cannula and went to the hospital.  Echocardiogram revealed normal EF with concern for COPD exacerbation.  Chest x-ray did not show edema.  CT of the chest showed severe CAC with severe bronchiectasis.  It was recommended she follow-up with pulmonary and she had pending PFTs with Dr. Isadora.  Her visit to clinic 09/18/2023 she became a lot of her blood pressures at home which she had been taking her medications.  Chronic shortness of breath and congestion with occasional productive cough that is unchanged.  Lymphedema is unchanged.  She continued to have regular follow-ups with [facility].  She was seen in clinic 01/19/2024 stating that she was doing well up until approximately 3 weeks prior to her appointment.  She noted a drop in her blood pressure when she brought her blood pressure log.  Blood pressure dropped to 81/55.  She denied any symptoms.  At time of transfer in spite of her current medication regimen she was continued to keep a blood pressure log return several weeks for further evaluation.   She was last seen in clinic 03/23/2024, doing well.  Blood pressure regimen had helped with maintaining a well-controlled blood pressure.  She not had huge fluctuations of her blood pressure and she had previously done.  She  occasionally have peripheral edema due to her lymphedema.  States she been compliant with her current medication regimen and has not had any hospitalizations.  There were no further medication changes that were made or further testing that was ordered.  She returns to clinic today stating that overall she has been doing fairly well.  Unfortunately see stated back in the month of May her blood pressures had spiked up on her again and stayed elevated for an extended period of time.  She also states that several evenings ago she had atypical discomfort she said she had a pain in her drawl across her shoulder blades and down into the top of her left arm that lasted for approximately 10 minutes.  She stated open with rest and not exertion she was just sitting outside but it did scare her.  She states since that time her blood pressure has not been improved.  She continues to keep a blood pressure log.  Has not missed any of her medications.  She has followed up with her pain doctor recently as well she states that she also has a labs scheduled with her PCP on 07/08/2024.  Denies any recent hospitalizations or visits to the emergency department.  ROS: 10 point review of system has been reviewed and concerning except ones been listed in HPI  Studies Reviewed EKG Interpretation Date/Time:  Wednesday June 23 2024 11:47:00 EDT Ventricular Rate:  60 PR Interval:  170 QRS Duration:  102 QT Interval:  436 QTC Calculation: 436 R Axis:   -  60  Text Interpretation: Normal sinus rhythm Left anterior fascicular block Cannot rule out Anterior infarct , age undetermined When compared with ECG of 18-Sep-2023 11:37, No significant change was found Confirmed by Gerard Frederick ([id number]) on 06/23/2024 11:49:29 AM    TTE 02/26/23 1. Left ventricular ejection fraction, by estimation, is 60 to 65%. The  left ventricle has normal function. The left ventricle has no regional  wall motion abnormalities. There is moderate left  ventricular hypertrophy.  Left ventricular diastolic  parameters are consistent with Grade I diastolic dysfunction (impaired  relaxation).   2. Right ventricular systolic function is normal. The right ventricular  size is normal.   3. The mitral valve is normal in structure. Mild mitral valve  regurgitation. No evidence of mitral stenosis.   4. The aortic valve is normal in structure. Aortic valve regurgitation is  mild. No aortic stenosis is present.   5. The inferior vena cava is normal in size with greater than 50%  respiratory variability, suggesting right atrial pressure of 3 mmHg.  Risk Assessment/Calculations       Physical Exam VS:  BP (!) 142/74 (BP Location: Left Arm, Patient Position: Sitting, Cuff Size: Normal)   Pulse 67   Ht 5' 4 (1.626 m)   Wt 174 lb (78.9 kg)   SpO2 96%   BMI 29.87 kg/m        Wt Readings from Last 3 Encounters:  06/23/24 174 lb (78.9 kg)  03/23/24 178 lb (80.7 kg)  01/27/24 175 lb 12.8 oz (79.7 kg)    GEN: Well nourished, well developed in no acute distress NECK: No JVD; No carotid bruits CARDIAC: RRR, no murmurs, rubs, gallops RESPIRATORY:  Clear to auscultation without rales, wheezing or rhonchi , respirations are unlabored at rest on room air ABDOMEN: Soft, non-tender, non-distended EXTREMITIES: Trace pretibial edema greater on the right than the left, venous discoloration noted to the bilateral lower extremities; No deformity   ASSESSMENT AND PLAN Primary hypertension with bouts of orthostatic hypotension.  Blood pressure today 142/74.  Blood pressure this morning 167/84.  Pressure she stated has been up and down.  She has been compliant with her current medication regimen of amlodipine  2.5 mg at bedtime and losartan  for blood pressure greater than 140/70.  She also has had several episodes of elevated pressures but significant drops where she would have been systolic blood pressure was 160 and then 106.  Her lightheadedness and dizziness  has not been as pronounced.  She has been advised with her pain medications sometimes these drops can happen.  She states when she does have these instances she lies down on the couch for about 30 minutes and then will get back up and continue with her day.  She has been encouraged to continue to monitor pressure 1 to 2 hours postmedication administration as well.  Coronary calcifications noted on CT scan stable prominent LAD region.  She had previously had stress testing done with [facility] clinic in 2022 was considered low risk.  With a recent episode of atypical pain and jaw pain shoulder pain and radiation into the left arm she is being scheduled for an updated Lexiscan Myoview to rule out any ischemic causes.  She has been continued on aspirin  81 mg daily rosuvastatin  20 mg daily.  EKG today revealed sinus rhythm with left anterior fascicular block with a rate of 68 with a possible old anterior infarct with no acute change.  Mixed hyperlipidemia with last LDL of 67.  She is  continued on 20 mg of rosuvastatin .  Ongoing management per PCP with updated labs scheduled.  Goal of LDL is less than 70 ideally 55.  Chronic shortness of breath that has remained stable.  Last echocardiogram completed in 2/24 revealed LVEF of 60 to 65%.  This likely multifactorial component of deconditioning.  Continued tobacco use with total cessation recommended.  Chronic lymphedema which is venous stasis discoloration to bilateral lower extremities with trace pretibial edema.  She states that she did have worsening swelling back in May when she had elevated blood pressures but that is now resolved.  She does continue to follow with [facility].    Informed Consent   Shared Decision Making/Informed Consent The risks [chest pain, shortness of breath, cardiac arrhythmias, dizziness, blood pressure fluctuations, myocardial infarction, stroke/transient ischemic attack, nausea, vomiting, allergic reaction, radiation exposure, metallic  taste sensation and life-threatening complications (estimated to be 1 in 10,000)], benefits (risk stratification, diagnosing coronary artery disease, treatment guidance) and alternatives of a nuclear stress test were discussed in detail with Ms. Aguilera and she agrees to proceed.     Dispo: Patient to return to clinic to see MD/APP in 3 months or sooner if needed pending results of stress testing.  Signed, Maiko Salais, NP

## 2024-06-23 NOTE — Patient Instructions (Addendum)
 Medication Instructions:  Your physician recommends that you continue on your current medications as directed. Please refer to the Current Medication list given to you today.   *If you need a refill on your cardiac medications before your next appointment, please call your pharmacy*  Lab Work: No labs ordered today  If you have labs (blood work) drawn today and your tests are completely normal, you will receive your results only by: MyChart Message (if you have MyChart) OR A paper copy in the mail If you have any lab test that is abnormal or we need to change your treatment, we will call you to review the results.  Testing/Procedures: Your provider has ordered a Lexiscan/ Exercise Myoview Stress test. This will take place at [facility]. Please report to the [facility] medical mall entrance. The volunteers at the first desk will direct you where to go.  [facility] MYOVIEW  Your provider has ordered a Stress Test with nuclear imaging. The purpose of this test is to evaluate the blood supply to your heart muscle. This procedure is referred to as a Non-Invasive Stress Test. This is because other than having an IV started in your vein, nothing is inserted or invades your body. Cardiac stress tests are done to find areas of poor blood flow to the heart by determining the extent of coronary artery disease (CAD). Some patients exercise on a treadmill, which naturally increases the blood flow to your heart, while others who are unable to walk on a treadmill due to physical limitations will have a pharmacologic/chemical stress agent called Lexiscan . This medicine will mimic walking on a treadmill by temporarily increasing your coronary blood flow.   Please note: these test may take anywhere between 2-4 hours to complete  How to prepare for your Myoview test:  Nothing to eat for 6 hours prior to the test No caffeine for 24 hours prior to test No smoking 24 hours prior to test. Your medication may be taken with  water.  If your doctor stopped a medication because of this test, do not take that medication. Ladies, please do not wear dresses.  Skirts or pants are appropriate. Please wear a short sleeve shirt. No perfume, cologne or lotion. Wear comfortable walking shoes. No heels!   PLEASE NOTIFY THE OFFICE AT LEAST 24 HOURS IN ADVANCE IF YOU ARE UNABLE TO KEEP YOUR APPOINTMENT.  [phone] AND  PLEASE NOTIFY NUCLEAR MEDICINE AT [facility] AT LEAST 24 HOURS IN ADVANCE IF YOU ARE UNABLE TO KEEP YOUR APPOINTMENT. [phone]   Follow-Up: At [facility], you and your health needs are our priority.  As part of our continuing mission to provide you with exceptional heart care, our providers are all part of one team.  This team includes your primary Cardiologist (physician) and Advanced Practice Providers or APPs (Physician Assistants and Nurse Practitioners) who all work together to provide you with the care you need, when you need it.  Your next appointment:   3 month(s) post procedure  Provider:   Tylene Lunch, NP

## 2024-07-16 ENCOUNTER — Other Ambulatory Visit: Payer: Self-pay | Admitting: Physician Assistant

## 2024-07-16 ENCOUNTER — Encounter
Admission: RE | Admit: 2024-07-16 | Discharge: 2024-07-16 | Disposition: A | Discharge status: Home / Self Care | Source: Ambulatory Visit | Attending: Cardiology | Admitting: Cardiology

## 2024-07-16 DIAGNOSIS — I2510 Atherosclerotic heart disease of native coronary artery without angina pectoris: Secondary | ICD-10-CM | POA: Insufficient documentation

## 2024-07-16 LAB — NM MYOCAR MULTI W/SPECT W/WALL MOTION / EF
LV dias vol: 62 mL (ref 46–106)
LV sys vol: 11 mL (ref 3.8–5.2)
Nuc Stress EF: 82 %
Peak HR: 81 {beats}/min
Percent HR: 59 %
Rest HR: 60 {beats}/min
Rest Nuclear Isotope Dose: 10.4 mCi
SDS: 0
SRS: 8
SSS: 1
Stress Nuclear Isotope Dose: 30.7 mCi
TID: 0.92

## 2024-07-16 MED ORDER — REGADENOSON 0.4 MG/5ML IV SOLN
0.4000 mg | Freq: Once | INTRAVENOUS | Status: AC
  Administered 2024-07-16: 0.4 mg via INTRAVENOUS
  Filled 2024-07-16: qty 5

## 2024-07-16 MED ORDER — TECHNETIUM TC 99M TETROFOSMIN IV KIT
10.0000 | PACK | Freq: Once | INTRAVENOUS | Status: AC | PRN
  Administered 2024-07-16: 10.35 via INTRAVENOUS

## 2024-07-16 MED ORDER — TECHNETIUM TC 99M TETROFOSMIN IV KIT
30.7400 | PACK | Freq: Once | INTRAVENOUS | Status: AC | PRN
  Administered 2024-07-16: 30.74 via INTRAVENOUS

## 2024-07-16 NOTE — Progress Notes (Unsigned)
     Madison Benson presented for a pharmacological nuclear stress test today.  I Bernardino Bring, PA-C, provided direct supervision and was present during the stress portion of the study today, which was completed without significant symptoms, immediate complications, or acute ST/T changes on ECG.  Stress imaging is pending at this time.  Preliminary ECG findings may be listed in the chart, but the stress test result will not be finalized until perfusion imaging is complete.  Bernardino Bring, PA-C  07/16/2024, 11:36 AM

## 2024-07-19 ENCOUNTER — Ambulatory Visit: Payer: Self-pay | Admitting: Medical

## 2024-09-28 ENCOUNTER — Encounter: Payer: Self-pay | Admitting: Cardiology

## 2024-09-28 ENCOUNTER — Ambulatory Visit: Attending: Cardiology | Admitting: Cardiology

## 2024-09-28 VITALS — BP 133/74 | HR 64 | Ht 60.0 in | Wt 175.8 lb

## 2024-09-28 DIAGNOSIS — I10 Essential (primary) hypertension: Secondary | ICD-10-CM | POA: Insufficient documentation

## 2024-09-28 DIAGNOSIS — E785 Hyperlipidemia, unspecified: Secondary | ICD-10-CM | POA: Insufficient documentation

## 2024-09-28 DIAGNOSIS — I2510 Atherosclerotic heart disease of native coronary artery without angina pectoris: Secondary | ICD-10-CM | POA: Diagnosis not present

## 2024-09-28 DIAGNOSIS — I890 Lymphedema, not elsewhere classified: Secondary | ICD-10-CM | POA: Insufficient documentation

## 2024-09-28 DIAGNOSIS — Z720 Tobacco use: Secondary | ICD-10-CM | POA: Insufficient documentation

## 2024-09-28 DIAGNOSIS — R0602 Shortness of breath: Secondary | ICD-10-CM | POA: Insufficient documentation

## 2024-09-28 NOTE — Patient Instructions (Signed)
 Medication Instructions:  Your physician recommends that you continue on your current medications as directed. Please refer to the Current Medication list given to you today.   *If you need a refill on your cardiac medications before your next appointment, please call your pharmacy*  Lab Work: No labs ordered today  If you have labs (blood work) drawn today and your tests are completely normal, you will receive your results only by: MyChart Message (if you have MyChart) OR A paper copy in the mail If you have any lab test that is abnormal or we need to change your treatment, we will call you to review the results.  Testing/Procedures: No test ordered today   Follow-Up: At [facility], you and your health needs are our priority.  As part of our continuing mission to provide you with exceptional heart care, our providers are all part of one team.  This team includes your primary Cardiologist (physician) and Advanced Practice Providers or APPs (Physician Assistants and Nurse Practitioners) who all work together to provide you with the care you need, when you need it.  Your next appointment:   3 - 4 month(s)  Provider:   You will see one of the following Advanced Practice Providers on your designated Care Team:   Lonni Meager, NP Lesley Maffucci, PA-C Bernardino Bring, PA-C Cadence Dighton, PA-C Tylene Lunch, NP Barnie Hila, NP

## 2024-09-28 NOTE — Progress Notes (Signed)
 Cardiology Office Note   Date:  09/28/2024  ID:  Shefali, Ng 1941/05/12, MRN [id number] PCP: Stanton Lynwood FALCON, MD  [place] Prov[facility] Cardiologist:  Alvan Ronal BRAVO, MD (Inactive) Cardiology APP:  Gerard Frederick, NP     History of Present Illness Madison Benson is a 83 y.o. female with past medical history of hyperlipidemia, hypertension, coronary calcification (seen on CT scan), active smoker, chronic shortness of breath, lymphedema, chronic back pain, who presents today for follow-up.   She previously admitted to the hospital February 2020 for discharge Due to shortness of breath.  She called EMS with sats noted to be 88% with increased work of breathing.  She was placed on 6 L of O2 via nasal cannula and went to the hospital.  Echocardiogram revealed normal EF with concern for COPD exacerbation.  Chest x-ray did not show edema.  CT of the chest showed severe CAC with severe bronchiectasis.  It was recommended she follow-up with pulmonary and she had pending PFTs with Dr. Isadora.  Her visit to clinic 09/18/2023 she became a lot of her blood pressures at home which she had been taking her medications.  Chronic shortness of breath and congestion with occasional productive cough that is unchanged.  Lymphedema is unchanged.  She continued to have regular follow-ups with [facility].  She was seen in clinic 01/19/2024 stating that she was doing well up until approximately 3 weeks prior to her appointment.  She noted a drop in her blood pressure when she brought her blood pressure log.  Blood pressure dropped to 81/55.  She denied any symptoms.  At time of transfer in spite of her current medication regimen she was continued to keep a blood pressure log return several weeks for further evaluation.  She was seen in clinic 03/23/2024 doing well.  Blood pressure regimen help with maintaining a well-controlled blood pressure.  At no further medication changes were made and further  testing was ordered at that time.   She was last seen in clinic 06/23/2024 at that time overall she was doing fairly well.  She had started to study back in the month of May blood pressure spiked up and again was staying elevated for extended period of time.  She also had several evenings where she had atypical discomfort she had pain that would control across her shoulder blades into the top of her left arm that would last for approximately 10 minutes.  She had labs scheduled with her PCP.  With concerning anginal equivalents she was scheduled for stress testing.  She returns to clinic today stating overall she has been doing fairly well.  Unfortunately she was concerned about having elevated blood pressures upon further discussion.  She has had several instances of increased amount of stress as of late and is now concerned as if her blood pressure cuff at home is encouraged.  She continues to monitor pressure at home regularly.  States that she has been compliant with her blood pressure medications and her other medications as well.  She had 1 episode of swelling to her bilateral lower extremities after starting on gabapentin which she has stopped.  She denies any hospitalizations or visits to the emergency department.  ROS: 10 point review of system has been reviewed and considered negative the exception was been listed in the HPI  Studies Reviewed     Lexiscan  MPI 07/16/2024   Normal pharmacologic myocardial perfusion stress test without evidence of significant ischemia or scar.   Normal left  ventricular systolic function (LVEF greater than 65%).   This is a low risk study.  TTE 02/26/23 1. Left ventricular ejection fraction, by estimation, is 60 to 65%. The  left ventricle has normal function. The left ventricle has no regional  wall motion abnormalities. There is moderate left ventricular hypertrophy.  Left ventricular diastolic  parameters are consistent with Grade I diastolic dysfunction  (impaired  relaxation).   2. Right ventricular systolic function is normal. The right ventricular  size is normal.   3. The mitral valve is normal in structure. Mild mitral valve  regurgitation. No evidence of mitral stenosis.   4. The aortic valve is normal in structure. Aortic valve regurgitation is  mild. No aortic stenosis is present.   5. The inferior vena cava is normal in size with greater than 50%  respiratory variability, suggesting right atrial pressure of 3 mmHg.  Risk Assessment/Calculations           Physical Exam VS:  BP 133/74 (BP Location: Left Arm, Patient Position: Sitting, Cuff Size: Normal) Comment: Her BP machine now.  Pulse 64   Ht 5' (1.524 m)   Wt 175 lb 12.8 oz (79.7 kg)   SpO2 98%   BMI 34.33 kg/m        Wt Readings from Last 3 Encounters:  09/28/24 175 lb 12.8 oz (79.7 kg)  06/23/24 174 lb (78.9 kg)  03/23/24 178 lb (80.7 kg)    GEN: Well nourished, well developed in no acute distress NECK: No JVD; No carotid bruits CARDIAC: RRR, no murmurs, rubs, gallops RESPIRATORY:  Clear to auscultation without rales, wheezing or rhonchi  ABDOMEN: Soft, non-tender, non-distended EXTREMITIES: Trace pretibial edema greater on the right than the left, venous discoloration noted to the bilateral lower extremities; No deformity   ASSESSMENT AND PLAN Primary hypertension without orthostasis today.  States that she has been compliant with her current medication regimen of amlodipine  5 mg at bedtime and losartan  50 mg for blood pressure greater than 140/70.  She has been encouraged to continue to monitor pressure 1 to 2 hours postmedication administration as well.  Coronary calcifications noted on CT scan prominent in the LAD region.  She had recently undergone stress testing earlier this year which was considered low risk scan.  She is continued on aspirin  81 mg daily and rosuvastatin  20 mg daily.  Mixed hyperlipidemia with last LDL 61. Continued on rosuvastatin  20 mg  daily.  Ongoing management per PCP.  Continues to remain at goal of less than 70 ideally goal would be less than 55.  Chronic shortness of breath which has remained stable.  Last echocardiogram completed revealed LVEF of 60 to 65% likely multifactorial and a component of deconditioning.  Continued tobacco abuse with total cessation continue to be recommended.  Chronic lymphedema with chronic venous stasis discoloration to the bilateral lower extremities with prescriptively edema.  Continues to be managed by [facility].       Dispo: Patient to return to clinic to see MD/APP in 3 to 4 months or sooner if needed for further evaluation  Signed, Daniele Dillow, NP

## 2024-12-07 ENCOUNTER — Encounter (INDEPENDENT_AMBULATORY_CARE_PROVIDER_SITE_OTHER): Payer: Self-pay | Admitting: Vascular Surgery

## 2024-12-07 ENCOUNTER — Ambulatory Visit (INDEPENDENT_AMBULATORY_CARE_PROVIDER_SITE_OTHER): Admitting: Vascular Surgery

## 2024-12-07 VITALS — BP 155/82 | HR 74 | Resp 17 | Ht 60.0 in | Wt 179.0 lb

## 2024-12-07 DIAGNOSIS — M79609 Pain in unspecified limb: Secondary | ICD-10-CM | POA: Insufficient documentation

## 2024-12-07 DIAGNOSIS — M79604 Pain in right leg: Secondary | ICD-10-CM

## 2024-12-07 DIAGNOSIS — M79605 Pain in left leg: Secondary | ICD-10-CM | POA: Diagnosis not present

## 2024-12-07 NOTE — Assessment & Plan Note (Signed)
 Arterial and venous noninvasive studies to be performed in the near future at her convenience.  Pain is likely multifactorial and certainly neuropathy is a likely cause, but given her age and other comorbidities arterial insufficiency could be contributing and we have not checked that.  Will also check a venous study as it has been sometime since this has been evaluated.

## 2024-12-07 NOTE — Progress Notes (Signed)
 MRN : [id number]  Madison Benson is a 83 y.o. (1941/12/29) female who presents with chief complaint of  Chief Complaint  Patient presents with   Follow-up    6 months no studies  .  History of Present Illness:   Discussed the use of AI scribe software for clinical note transcription with the patient, who gave verbal consent to proceed.  History of Present Illness Madison Benson is an 82 year old female who presents with leg swelling and foot pain. She is accompanied by her daughter, Madison Benson.  She has had significant leg swelling since June that improved after stopping gabapentin but did not fully resolve. She uses compression pumps with assistance twice weekly, which reduces swelling, but she is unable to use them daily.  She has not been able to wear her compression socks due to the pain it causes in her feet.  She has persistent severe pain on the bottoms of both feet that can wake her at night without treatment. She suspects neuropathy or plantar fasciitis. A recent ankle sprain has worsened pain around the big toe and heel and makes it hard to find comfortable shoes.  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  This pain has become severe and debilitating.  She had worsening swelling with gabapentin and has stopped that but her pain is persistent and severe.  She has fluctuating blood pressure, which she adjusts with medication timing. Her blood pressure has been more stable recently but is occasionally high.    Results   Current Outpatient Medications  Medication Sig Dispense Refill   albuterol  (VENTOLIN  HFA) 108 (90 Base) MCG/ACT inhaler Inhale 1-2 puffs into the lungs every 4 (four) hours as needed. 8 g 0   amLODipine  (NORVASC ) 2.5 MG tablet Take 5 mg by mouth at bedtime.     aspirin  81 MG EC tablet Take 81 mg by mouth daily.     Calcium -Vitamin D -Vitamin K 750-500-40 MG-UNT-MCG TABS Take 1 tablet by mouth daily.      cholecalciferol  (VITAMIN D ) 1000 units tablet Take 1,000 Units by mouth 2 (two) times daily.     cyanocobalamin  (VITAMIN B12) 1000 MCG tablet Take 1,000 mcg by mouth daily.     DULoxetine  (CYMBALTA ) 60 MG capsule Take 60 mg by mouth at bedtime.     Ferrous Sulfate  Dried 45 MG TBCR Take 65 mg by mouth daily.     losartan  (COZAAR ) 50 MG tablet Take 50 mg by mouth as directed. Take 1 tablet by mouth as needed for BP >150/80.     Magnesium  200 MG TABS Take 200 mg by mouth every other day. Taking 100 mg daily     omeprazole (PRILOSEC) 20 MG capsule Take 20 mg by mouth daily.     Oxycodone  HCl 10 MG TABS Take 10 mg by mouth 3 (three) times daily.     polyethylene glycol (MIRALAX  / GLYCOLAX ) 17 g packet Take 17 g by mouth daily.     rosuvastatin  (CRESTOR ) 20 MG tablet Take 1 tablet (20 mg total) by mouth daily. 90 tablet 3   solifenacin (VESICARE) 5 MG tablet Take 5 mg by mouth every evening.     tiZANidine  (ZANAFLEX ) 4 MG tablet Take 4 mg by mouth 2 (two) times daily.     UNABLE TO FIND Take 2 capsules by mouth daily. Med Name: [facility] MD- Lymph MD     UNABLE TO FIND Take 1 capsule by mouth at bedtime. Med  Name: Preprobiotic and probiotic combination pill     No current facility-administered medications for this visit.    Past Medical History:  Diagnosis Date   Chronic back pain    HLD (hyperlipidemia)    Hypertension    Lymphedema    Smoker     Past Surgical History:  Procedure Laterality Date   BACK SURGERY       Social History   Tobacco Use   Smoking status: Every Day    Current packs/day: 0.50    Average packs/day: 0.5 packs/day for 64.0 years (32.0 ttl pk-yrs)    Types: Cigarettes   Smokeless tobacco: Never   Tobacco comments:    7 cigarettes daily- 05/19/23  Vaping Use   Vaping status: Never Used  Substance Use Topics   Alcohol use: Yes    Alcohol/week: 5.0 standard drinks of alcohol    Types: 5 Glasses of wine per week   Drug use: No      Family History   Problem Relation Age of Onset   Heart attack Mother    Heart attack Father    Hypertension Other      Allergies  Allergen Reactions   Latex Rash    After band aid left for couple days After band aid left for couple days After band aid left for couple days    Nsaids Diarrhea and Nausea And Vomiting   Sulfamethoxazole Other (See Comments)    Other Reaction(s): abdominal pain   Sulfa Antibiotics Diarrhea and Nausea And Vomiting   Ciprofloxacin Itching   Diclofenac Sodium Diarrhea   Naproxen Diarrhea    Any anti-inflammatory causes severe diarrhea Any anti-inflammatory causes severe diarrhea Any anti-inflammatory causes severe diarrhea    Sulfamethoxazole-Trimethoprim Nausea And Vomiting and Nausea Only      REVIEW OF SYSTEMS (Negative unless checked)   Constitutional: [] Weight loss  [] Fever  [] Chills Cardiac: [] Chest pain   [] Chest pressure   [] Palpitations   [] Shortness of breath when laying flat   [] Shortness of breath at rest   [] Shortness of breath with exertion. Vascular:  [] Pain in legs with walking   [] Pain in legs at rest   [] Pain in legs when laying flat   [] Claudication   [x] Pain in feet when walking  [x] Pain in feet at rest  [] Pain in feet when laying flat   [] History of DVT   [] Phlebitis   [x] Swelling in legs   [] Varicose veins   [] Non-healing ulcers Pulmonary:   [] Uses home oxygen   [] Productive cough   [] Hemoptysis   [] Wheeze  [] COPD   [] Asthma Neurologic:  [] Dizziness  [] Blackouts   [] Seizures   [] History of stroke   [] History of TIA  [] Aphasia   [] Temporary blindness   [] Dysphagia   [] Weakness or numbness in arms   [] Weakness or numbness in legs Musculoskeletal:  [x] Arthritis   [] Joint swelling   [x] Joint pain   [x] Low back pain Hematologic:  [] Easy bruising  [] Easy bleeding   [] Hypercoagulable state   [] Anemic   Gastrointestinal:  [] Blood in stool   [] Vomiting blood  [] Gastroesophageal reflux/heartburn   [] Abdominal pain Genitourinary:  [] Chronic kidney disease    [] Difficult urination  [] Frequent urination  [] Burning with urination   [] Hematuria Skin:  [] Rashes   [] Ulcers   [] Wounds Psychological:  [] History of anxiety   []  History of major depression.  Physical Examination  BP (!) 155/82   Pulse 74   Resp 17   Ht 5' (1.524 m)   Wt 179 lb (81.2 kg)  BMI 34.96 kg/m  Gen:  WD/WN, NAD Head: /AT, No temporalis wasting. Ear/Nose/Throat: Hearing grossly intact, nares w/o erythema or drainage Eyes: Conjunctiva clear. Sclera non-icteric Neck: Supple.  Trachea midline Pulmonary:  Good air movement, no use of accessory muscles.  Cardiac: RRR, no JVD Vascular:  Vessel Right Left  Radial Palpable Palpable       Musculoskeletal: M/S 5/5 throughout.  No deformity or atrophy.  2-3+ right lower extremity edema, 1-2+ left lower extremity edema.  Marked stasis dermatitis changes are present bilaterally.  There is a large amount of hyperpigmentation and skin thickening worse on the right than the left. Neurologic: Sensation grossly intact in extremities.  Symmetrical.  Speech is fluent.  Psychiatric: Judgment intact, Mood & affect appropriate for pt's clinical situation. Dermatologic: No rashes or ulcers noted.  No cellulitis or open wounds.  Physical Exam     Labs No results found for this or any previous visit (from the past 2160 hours).  Radiology No results found.  Assessment/Plan Assessment & Plan Lymphedema Chronic lymphedema with persistent leg swelling. Gabapentin may contribute to swelling. Now off of gabapentin. Blood pressure fluctuations managed by adjusting medication timing. Compression stockings not tolerated due to foot pain. Differential includes arterial insufficiency and thrombophlebitis. - Ordered arterial ultrasound to assess blood flow and rule out arterial insufficiency. - Ordered vein ultrasound to check for thrombophlebitis or clots. - Encouraged use of lymphedema pump as tolerated. - Discussed red light therapy as  potential adjunct for skin health.  HLD (hyperlipidemia) lipid control important in reducing the progression of atherosclerotic disease. Continue statin therapy     CKD (chronic kidney disease) stage 3, GFR 30-59 ml/min (HCC) Can certainly worsen LE edema.     HTN (hypertension) blood pressure control important in reducing the progression of atherosclerotic disease. On appropriate oral medications.    Pain in limb Arterial and venous noninvasive studies to be performed in the near future at her convenience.  Pain is likely multifactorial and certainly neuropathy is a likely cause, but given her age and other comorbidities arterial insufficiency could be contributing and we have not checked that.  Will also check a venous study as it has been sometime since this has been evaluated.    Selinda Gu, MD  12/07/2024 3:08 PM    This note was created with Dragon medical transcription system.  Any errors from dictation are purely unintentional

## 2024-12-29 ENCOUNTER — Other Ambulatory Visit (INDEPENDENT_AMBULATORY_CARE_PROVIDER_SITE_OTHER): Payer: Self-pay | Admitting: Vascular Surgery

## 2024-12-29 DIAGNOSIS — M79604 Pain in right leg: Secondary | ICD-10-CM

## 2025-01-04 NOTE — Progress Notes (Signed)
 Date of Visit:  01/04/2025  CHIEF COMPLAINT: Pain at neck and knee   PAIN DESCRIPTION: Madison Benson is a 84 y.o. female who is here today for an injection.  --- REFERRING MD: Medford Daring, MD PCP: Norleen Elsie Ada, DO Date of Initial Consult: 02/14/2017   PAIN HISTORY: (MY/  ) The patient was referred for evaluation and treatment of multiple pain complaints significantly neck and right knee pain.  This patient has a very significant and very long history of multiple pain complaints and has had numerous surgeries, procedures, and medications to address.  She has had multiple surgeries to address cervical radiculopathy and degenerative disc disease and is now fused from occiput to T2.  Additionally she has had lumbar radiculopathy as well and has undergone fusion from L1 to sacrum.  Patient has had multiple fractures of her right ankle which continues to cause significant chronic pain.  Effective this chronic pain led to change in her walking pattern that she feels is contributed to significant right knee pain.  This knee pain is a constant ache that gets severe with standing and walking.  Patient states that this pain is perhaps 1 of her most limiting pain generators at this time and prevents her from doing activities of daily living and housework.  She has had her left shoulder replaced and was told that she would require right shoulder replacement as well.  However following her first shoulder replacement she had persistent nerve pain and was afraid to undergo the same procedure on the right shoulder. Recently this pain is increased and she underwent intra-articular steroid injection with some improvement.  She is now considering undergoing right total shoulder replacement.  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  She recently underwent a series of 2 CT-guided  C2 selective nerve injections. The first injection helped for approximately 2 weeks and the second injection was unhelpful.   For these multiple pain complaints the patient has tried a number of medication, and is currently taking 75 mcg of fentanyl  by patch, OxyContin  twice daily, as well as tramadol 50 mg every 8 hours as needed, tizanidine  4 mg twice daily, prednisone  5 mg every morning, duloxetine  60 mg nightly.    PAST PAIN RELEVANT SURGERIES: Multiple cervical surgeries, posteriorly fused from occiput to T2 Multiple lumbar surgeries, fused from L1 to sacrum Total shoulder replacement, left Arthroscopic surgery, right knee Multiple ankle surgeries   PAST PAIN TREATMENTS TRIED: NSAID's (Allergic), pain medications (Cymbalta , Duragesic , Hydrocodone, Lyrica, Neurontin, Oxycontin , Percocet, Vicodin), muscle relaxers (Flexeril, Robaxin, Skelaxin, Zanaflex ), oral steroids, physical therapy, brace, cervicalinjections (unknown level), rest, ice and massage   PAST INJECTIONS DONE: Cervical epidural steroid injection, lumbar epidural steroid injection, intra-articular knee injection, intra-articular shoulder injection, lumbar facet injections 03/25/2017: Greater Occipital Nerve Block and cervical TPI, Right side 05/29/2017: Botox (significant improvement in pain, ~2 months relief) 08/29/2017: Botox 11/26/2017: Botox   01/02/2018: Genicular Nerve Blocks 02/06/2018: cRFA of right genicular nerves 03/02/2018: Botox 05/26/2018: Botox 08/21/2018: Botox 11/19/2018: Botox   06/10/2019: Botox 09/15/2019: Botox 12/14/2019: Botox   03/15/2020: Botox 06/21/2020: Botox for Cervical Dystonia 09/25/2020: Botox for Cervical Dystonia (300U) 01/25/2021: Botox  04/27/2021: Botox for Cervical Dystonia 07/30/2021: Botox for Cervical Dystonia 10/31/2021: Botox for Cervical Dystonia   02/05/2022: Botox for Cervical Dystonia 05/08/2022: Botox for Cervical Dystonia 08/09/2022: Botox for Cervical Dystonia 09/05/2022: Peripheral  Nerve Blocks  for the Right Shoulder & Left GT Bursa 11/12/2022: Xeomin for Cervical Dystonia   02/12/2023: Xeomin for Cervical Dystonia 05/30/2023: Xeomin for Cervical Dystonia 09/05/2023: Xeomin for Cervical Dystonia 10/02/2023: Bilateral Knee Injections 12/08/2023: Xeomin for Cervical Dystonia   03/22/2024: Xeomin for Cervical Dystonia 09/22/2024: TPI of Cervical Spine   PERTINENT RADIOGRAPHIC STUDIES:    PAST MEDICATIONS TRIED AND EFFECT:  Class Med Tried-Response  NSAIDs  allergic  Weak mu agonist  Tramadol  Anti-epileptic  Lyrica and Neurontin  Tricicylics, Other antidepr.  Cymbalta   Muscle relaxant  Cyclobenzaprine (Flexeril), metaxalone (Skelaxin),  methocarbamol (Robaxin) and tizanidine  (Zanaflex )  Miscellaneous  oral steroid    Opioids  Duragesic , Hydrocodone, Lyrica, Neurontin,   Oxycodone , Percocet, Tylenol  and Ultram,   OxyContin     The following measures were taken to ensure proper prescribing of opioid medications: Opioid agreement was signed on:  07/16/2017 (at [facility])   Baseline PEG Score (02/14/2017): 7 on high opioids (hasn't been off opioids in years)  would be >10 off medications) Last Psych screening test done: N/A [place] Controlled Substance Reporting System Last checked: 01/25/2021 There were not any concerning activities noted.   Last urine drug screen: 07/16/2017, + expect tramadol, oxycodone  and Fentanyl . 05/26/2018:  As expected +  oxycodone  and Fentanyl . 02/22/2019: As expected, + Fentanyl  and Oxycodone . 03/15/2020: As expected, + Fentanyl  and Oxycodone . 01/25/2021: As expected, + Fent and Oxy. 02/05/2022: As expected, + Fent & Oxy. 02/12/2023: As Expected, + Fent & Oxy Also positive for dextromethophan. 03/22/2024: Expect Oxycodone     NO SHOWS AND CANCELLATIONS: 01/01/2021: Late cancellation, severe weather, [place] ---  ALLERGIES: Allergies[1]  MEDS:  Prior to Admission medications     oxyCODONE  (ROXICODONE ) 10 mg immediate release tablet Take 1 tablet (10 mg total) by mouth  every 8 (eight) hours as needed for up to 30 days. She has 10 extra tabs she can use on severe pain days (10 days a month can take QID PRN)  amLODIPine  (NORVASC ) 2.5 MG tablet Take 1 tablet (2.5 mg total) by mouth 2 (two) times a day.  aspirin  81 MG chewable tablet Chew 1 tablet (81 mg total) daily as needed.  cream base no.40, bulk, (PENTRAVAN PLUS) Crea Lidocaine 5%, Diclofenac 3%, Cyclobenzaprine 2%, Gabapentin 2%, and Ketamine 10%. Apply 1-2 pumps to affected area on feet 3-4 times per day (Max of 8 pumps per day)  duloxetine  (CYMBALTA ) 60 MG capsule Take 1 capsule (60 mg total) by mouth.  losartan  (COZAAR ) 50 MG tablet Take 1 tablet (50 mg total) by mouth 2 (two) times a day as needed.  omeprazole (PRILOSEC) 20 MG capsule Take 1 capsule (20 mg total) by mouth daily as needed.  onabotulinumtoxinA (BOTOX) 200 unit SolR 200 units to be injected IM by Dr. Hughie in clinic every 90 days for cervical dystonia  oxyCODONE  (ROXICODONE ) 10 mg immediate release tablet Take 1 tablet (10 mg total) by mouth 3 (three) times a day as needed for up to 30 days. She has 10 extra tabs she can use on severe pain days (10 days a month can take QID PRN)  oxyCODONE  (ROXICODONE ) 10 mg immediate release tablet Take 1 tablet (10 mg total) by mouth 3 (three) times a day as needed for up to 30 days. She has 10 extra tabs she can use on severe pain days (10 days a month can take QID PRN)  rosuvastatin  (CRESTOR ) 20 MG tablet Take 1 tablet (20 mg total) by mouth daily.  solifenacin (VESICARE) 5 MG tablet Take 1 tablet (  5 mg total) by mouth daily as needed.  tizanidine  (ZANAFLEX ) 4 MG tablet TAKE 1 TABLET TWICE A DAY (1 TABLET IN THE MORNING AND 1 TABLET AT NIGHT)   PHYSICAL EXAM:   Vitals: There were no vitals taken for this visit., Pain Score:7/10  Psych: Patient was alert, oriented x 3, intact speech pattern, content and tone  DIAGNOSIS:  Cervical dystonia  (primary encounter diagnosis) Myofascial pain Chronic pain  syndrome Postlaminectomy syndrome Encounter for monitoring opioid maintenance therapy Peripheral polyneuropathy  Prescriptions given at todays visit:  New Prescriptions   CREAM BASE NO.40, BULK, (PENTRAVAN PLUS) CREA    Lidocaine 5%, Diclofenac 3%, Cyclobenzaprine 2%, Gabapentin 2%, and Ketamine 10%. Apply 1-2 pumps to affected area on feet 3-4 times per day (Max of 8 pumps per day)   OXYCODONE  (ROXICODONE ) 10 MG IMMEDIATE RELEASE TABLET    Take 1 tablet (10 mg total) by mouth 3 (three) times a day as needed for up to 30 days. She has 10 extra tabs she can use on severe pain days (10 days a month can take QID PRN)   OXYCODONE  (ROXICODONE ) 10 MG IMMEDIATE RELEASE TABLET    Take 1 tablet (10 mg total) by mouth 3 (three) times a day as needed for up to 30 days. She has 10 extra tabs she can use on severe pain days (10 days a month can take QID PRN)   PLAN:  - A discussion with the patient was performed prior to the procedure and risks and benefits of the procedure were discussed. - The patient gave both a verbal and written consent prior to the procedure. - I have personally reviewed and updated the pain description, ROS, Physical Exam, and plan today.  - I wrote prescriptions for the medications listed above, and informed the patient about potential side effects. - The patient was prescribed opioid medications at this visit and has a signed opioid agreement with [facility].  The pt was informed of the policies and procedures regarding appropriate behavior in the clinic and the use of random urine drug screens to assess medication compliance. he  is reminded to not drive while utilizing he medications.  The patient is aware that the prescriptions are for his/her use only and any lost or stolen prescriptions will not be replaced.  At least once every 90 days, midlevel prescribers of opioid medications consult with and have meaningful communication with an attending physician to verify that  the prescription remains medically appropriate.  The patient was interviewed and evaluated for risk of misuse of opioids.  The prescription Drug Monitoring Program was checked prior to prescribing this medication, and any discrepancies or violations with the opioid agreement were addressed with the patient and noted in progress note above.  The registry was last accessed today.  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 Symptoms include difficulty looking forward while walking.  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 Previous management included high-dose fentanyl , which has been successfully weaned off. - Started compound cream containing ketamine for application to feet only. Rx sent to [facility] - Instructed to discontinue cream if any side effects occur.  Chronic opioid therapy for chronic pain Chronic pain management with oxycodone , currently taking 10 mg three to four times a day, with a prescription of 100 pills per month. Recent prescription was for  90 pills instead of 100. - Continue current oxycodone  regimen of 10 mg three to four times a day. (Fill dates: 1/18, 2/17, & 3/19.  Return for Repeat Botox and f/u for meds in 90 days (pt self pay botox).  No future appointments.  PROCEDURE: Patient Profile: Madison Benson is a 84 y.o. year old female here today for botox injections for cervical dystonia.  The patient was assessed today prior to the start of the procedure.    Pre-operative Diagnosis: Cervical Dystonia Post-operative Diagnosis: Same Surgeon: Oliva Alm Horns Estimated Blood Loss: Minimal Findings/complications: None Specimens Removed: None  Procedure: Botox Injection  Description of procedure: The patient was identified in the procedure room,  and the medical history, chart, and patient allergies were reviewed.  The site of procedure was marked.  The risks, benefits, and alternatives to the procedure were discussed, and the patient gave verbal and written, informed consent.  The patients blood pressure, oxygenation, and heart rate were continuously monitored.  The practitioner wore gloves.  The Botox A was reconsituted by diluting 200 units with 2 ml of preservative-free normal saline, making the final concentration 5 Units per 0.1 mL.    The patient was placed in a sitting position.  The targeted sites were identified, marked, and cleansed with alcohol.  A 27 gauge 0.5 inch tuberculin needle was used to inject each site with botox.  The muscles injected were as follows: Splenius capitis (15 Units), splenius cervicalis (20 Units), sternocleidomastoid (20 Units), trapezius (30 Units), levator scapulae (15 Units) per side.  A total of 200 Units were used to inject the 10 sites. The patient tolerated the procedure well without any apparent complications.  The patient was monitored in the recovery area before being discharged home in stable condition.  - This note was partially dictated using Dragon voice recognition software. While generally accurate, occasional transcription errors may occur and impact the intended meaning. Please review the chart with this in mind. The patient presents for follow-up care of their ongoing and complex condition (cervical dystonia, chronic pain, opioid managment). This visit involved addressing the intricacies of managing this serious condition within the context of our long-standing patient-practitioner relationship. Treatment decisions were guided by our knowledge of the patient's illness trajectory and response to previous therapies, distinguishing this visit from a routine or time-limited encounter  [id number] for ongoing longitudinal care of patient's serious/complex condition    Oliva Alm Horns, MD   Electronically signed 01/04/2025        [1] Allergies Allergen Reactions   Latex, Natural Rubber Itching and Rash    After band aid left for couple days  After band aid left for couple days  After band aid left for couple days  After band aid left for couple days  Band aids   Sulfa (Sulfonamide Antibiotics) Other (See Comments), Diarrhea, Hives and Nausea And Vomiting    Other Reaction(s): abdominal pain   Ciprofloxacin Itching   Naproxen Diarrhea    Any anti-inflammatory causes severe diarrhea  Any anti-inflammatory causes severe diarrhea  Any anti-inflammatory causes severe diarrhea  Any anti-inflammatory causes severe diarrhea   Nsaids (Non-Steroidal Anti-Inflammatory Drug) Diarrhea   Sulfamethoxazole-Trimethoprim Nausea And Vomiting and Nausea Only

## 2025-01-06 ENCOUNTER — Ambulatory Visit: Attending: Cardiology | Admitting: Cardiology

## 2025-01-06 ENCOUNTER — Encounter: Payer: Self-pay | Admitting: Cardiology

## 2025-01-06 VITALS — BP 179/82 | HR 64 | Ht 60.0 in | Wt 177.8 lb

## 2025-01-06 DIAGNOSIS — Z720 Tobacco use: Secondary | ICD-10-CM | POA: Diagnosis present

## 2025-01-06 DIAGNOSIS — E785 Hyperlipidemia, unspecified: Secondary | ICD-10-CM | POA: Insufficient documentation

## 2025-01-06 DIAGNOSIS — I890 Lymphedema, not elsewhere classified: Secondary | ICD-10-CM | POA: Diagnosis present

## 2025-01-06 DIAGNOSIS — I10 Essential (primary) hypertension: Secondary | ICD-10-CM | POA: Diagnosis present

## 2025-01-06 DIAGNOSIS — I2510 Atherosclerotic heart disease of native coronary artery without angina pectoris: Secondary | ICD-10-CM | POA: Diagnosis present

## 2025-01-06 DIAGNOSIS — R0602 Shortness of breath: Secondary | ICD-10-CM | POA: Insufficient documentation

## 2025-01-06 NOTE — Progress Notes (Signed)
 " Cardiology Office Note   Date:  01/06/2025  ID:  Aaliah Jorgenson, DOB [birth year], MRN [id number] PCP: Valora Lynwood FALCON, MD  [facility] Providers Cardiologist:  Alvan Ronal BRAVO, MD (Inactive) Cardiology APP:  Gerard Frederick, NP     History of Present Illness Madison Benson is a 84 y.o. female with a past medical history of hyperlipidemia, hypertension, coronary calcification (seen on CT scan), active smoker, chronic shortness of breath, lymphedema, chronic back and knee pain, who presents today for follow-up.   She previously admitted to the hospital February 2020 for discharge Due to shortness of breath.  She called EMS with sats noted to be 88% with increased work of breathing.  She was placed on 6 L of O2 via nasal cannula and went to the hospital.  Echocardiogram revealed normal EF with concern for COPD exacerbation.  Chest x-ray did not show edema.  CT of the chest showed severe CAC with severe bronchiectasis.  It was recommended she follow-up with pulmonary and she had pending PFTs with Dr. Isadora.  Her visit to clinic 09/18/2023 she became a lot of her blood pressures at home which she had been taking her medications.  Chronic shortness of breath and congestion with occasional productive cough that is unchanged.  Lymphedema is unchanged.  She continued to have regular follow-ups with [facility].  She was seen in clinic 01/19/2024 stating that she was doing well up until approximately 3 weeks prior to her appointment.  She noted a drop in her blood pressure when she brought her blood pressure log.  Blood pressure dropped to 81/55.  She denied any symptoms.  At time of transfer in spite of her current medication regimen she was continued to keep a blood pressure log return several weeks for further evaluation.  She was seen in clinic 03/23/2024 doing well.  Blood pressure regimen help with maintaining a well-controlled blood pressure.  At no further medication changes were made and  further testing was ordered at that time.  She was seen 06/24/2023 overall doing well.  She had started keeping a log of her blood pressures as her blood pressure had been up and down was staying elevated for extended periods of time.  She had labs scheduled with her PCP.  She had complaints of atypical discomfort and with a angina equivalent she was scheduled for stress testing.  She was last seen in clinic 09/28/2024 stating overall she had been doing fairly well.  Unfortunately she was concerned about having elevated blood pressures upon further discussion.  She had several instances of increased amount of stress as of late and now concerns of her blood pressure cuff at home is accurate.  She continues to monitor pressures at home regularly.  States that she has been compliant with her blood pressure medications and her other medications as well.  She had 1 episode of swelling to her lower extremities but continued to complain of pain.  There were no medication changes that were made or further testing that was ordered at the time.   She returns to clinic today stating that she has been under a large quantity of stress.  Her husband's had a heart attack and required stent placement, her son-in-law has recently been diagnosed with cancer and is admitted in the hospital in [place] .  Blood pressure has been well-controlled at home with occasional bouts of elevations but not low as it was previously.  She states that she did follow-up with vascular and has not had  any further swelling in her lower extremities and has some testing coming up for venous study with vascular.  She states she is also followed up with pain clinic and her primary care provider.  She states that she was taking all of her medications in the morning with the exception of her 1 scheduled just for night and she has moved the large quantity of her medications from the morning to the evening and she thinks that is helped with some issues she  was having with her blood pressure.  She states that she had also had hematuria for 2 days and then it spontaneously stopped she had called her PCPs office to get a referral for urology and has an appointment coming up later in the month.  She denies any recent hospitalizations or visits to the emergency department for herself.  ROS: 10 point review of system has been reviewed and considered negative exception was listed in the HPI  Studies Reviewed EKG Interpretation Date/Time:  Thursday January 06 2025 11:24:36 EST Ventricular Rate:  64 PR Interval:  168 QRS Duration:  104 QT Interval:  424 QTC Calculation: 437 R Axis:   -58  Text Interpretation: Sinus rhythm with Premature atrial complexes Incomplete right bundle branch block Left anterior fascicular block When compared with ECG of 23-Jun-2024 11:47, Confirmed by Gerard Frederick ([id number]) on 01/06/2025 11:33:15 AM    Lexiscan  MPI 07/16/2024   Normal pharmacologic myocardial perfusion stress test without evidence of significant ischemia or scar.   Normal left ventricular systolic function (LVEF greater than 65%).   This is a low risk study.   TTE 02/26/23 1. Left ventricular ejection fraction, by estimation, is 60 to 65%. The  left ventricle has normal function. The left ventricle has no regional  wall motion abnormalities. There is moderate left ventricular hypertrophy.  Left ventricular diastolic  parameters are consistent with Grade I diastolic dysfunction (impaired  relaxation).   2. Right ventricular systolic function is normal. The right ventricular  size is normal.   3. The mitral valve is normal in structure. Mild mitral valve  regurgitation. No evidence of mitral stenosis.   4. The aortic valve is normal in structure. Aortic valve regurgitation is  mild. No aortic stenosis is present.   5. The inferior vena cava is normal in size with greater than 50%  respiratory variability, suggesting right atrial pressure of 3 mmHg.    Risk Assessment/Calculations       Physical Exam VS:  BP (!) 179/82 (BP Location: Left Arm, Patient Position: Sitting, Cuff Size: Normal)   Pulse 64   Ht 5' (1.524 m)   Wt 177 lb 12.8 oz (80.6 kg)   BMI 34.72 kg/m        Wt Readings from Last 3 Encounters:  01/06/25 177 lb 12.8 oz (80.6 kg)  12/07/24 179 lb (81.2 kg)  09/28/24 175 lb 12.8 oz (79.7 kg)    GEN: Well nourished, well developed in no acute distress NECK: No JVD; No carotid bruits CARDIAC: RRR, no murmurs, rubs, gallops RESPIRATORY:  Clear to auscultation without rales, wheezing or rhonchi  ABDOMEN: Soft, non-tender, non-distended EXTREMITIES:  No edema; No deformity; venous discoloration noted to the bilateral lower extremities  ASSESSMENT AND PLAN Primary hypertension with a blood pressure today of 179/82.  Pressure is slightly elevated at appointment today.  Repeat blood pressure was even higher on retake.  She states that she has not taken her as needed blood pressure medication.  She did bring her blood  pressure log from home and blood pressures had it fairly well been maintained with systolic blood pressures of 130s to 140.  She has been encouraged to continue with her amlodipine  5 mg at bedtime and her losartan  50 mg for blood pressures greater than 150 systolic as it has been working well to maintain her blood pressure around the 130s and 140s.  She has been encouraged to maintain a blood pressure log at home and call if there are any changes in her blood pressures being too elevated or dropping too low.  Coronary calcification noted on CT scan prominent in the LAD region.  She remains chest pain-free today.  Had also recently undergone stress testing which was considered low risk.  She is continued on aspirin  81 mg and rosuvastatin  20 mg daily.  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  No ischemic changes noted from  prior studies.  No further ischemic evaluation needed at this time.  Mixed hyperlipidemia with an LDL of 61.  She is continued on rosuvastatin  20 mg daily.  Ongoing management per PCP.  Chronic shortness of breath with ongoing tobacco use which has remained stable.  Recent echocardiogram revealed an LVEF of 60 to 65% so shortness of breath is likely multifactorial and a component of deconditioning.  Total cessation is also continued to be recommended for tobacco use.  Chronic lymphedema with chronic venous stasis discoloration to the bilateral lower extremities.  She has venous studies coming up.  Ongoing management per [facility].        Dispo: Patient to return to clinic see MD/APP in 6 months or sooner if needed for further evaluation.  Signed, Tyjae Issa, NP   "

## 2025-01-06 NOTE — Patient Instructions (Signed)
 Medication Instructions:  Your physician recommends that you continue on your current medications as directed. Please refer to the Current Medication list given to you today.   *If you need a refill on your cardiac medications before your next appointment, please call your pharmacy*  Lab Work: No labs ordered today  If you have labs (blood work) drawn today and your tests are completely normal, you will receive your results only by: MyChart Message (if you have MyChart) OR A paper copy in the mail If you have any lab test that is abnormal or we need to change your treatment, we will call you to review the results.  Testing/Procedures: No test ordered today   Follow-Up: At [facility], you and your health needs are our priority.  As part of our continuing mission to provide you with exceptional heart care, our providers are all part of one team.  This team includes your primary Cardiologist (physician) and Advanced Practice Providers or APPs (Physician Assistants and Nurse Practitioners) who all work together to provide you with the care you need, when you need it.  Your next appointment:   6 month(s)  Provider:   Lonni Meager, NP Lesley Maffucci, PA-C Bernardino Bring, PA-C Cadence Franchester, PA-C Tylene Lunch, NP Barnie Hila, NP

## 2025-01-07 ENCOUNTER — Ambulatory Visit (INDEPENDENT_AMBULATORY_CARE_PROVIDER_SITE_OTHER)

## 2025-01-07 ENCOUNTER — Ambulatory Visit (INDEPENDENT_AMBULATORY_CARE_PROVIDER_SITE_OTHER): Admitting: Vascular Surgery

## 2025-01-07 DIAGNOSIS — M79604 Pain in right leg: Secondary | ICD-10-CM | POA: Diagnosis not present

## 2025-01-07 DIAGNOSIS — M79605 Pain in left leg: Secondary | ICD-10-CM | POA: Diagnosis not present

## 2025-01-10 LAB — VAS US ABI WITH/WO TBI
Left ABI: 1.01
Right ABI: 1.05

## 2025-01-13 ENCOUNTER — Encounter (INDEPENDENT_AMBULATORY_CARE_PROVIDER_SITE_OTHER): Payer: Self-pay | Admitting: Nurse Practitioner

## 2025-01-13 ENCOUNTER — Ambulatory Visit (INDEPENDENT_AMBULATORY_CARE_PROVIDER_SITE_OTHER): Admitting: Nurse Practitioner

## 2025-01-13 VITALS — BP 156/84 | HR 74 | Resp 17 | Ht 60.0 in | Wt 177.0 lb

## 2025-01-13 DIAGNOSIS — I872 Venous insufficiency (chronic) (peripheral): Secondary | ICD-10-CM

## 2025-01-13 DIAGNOSIS — I890 Lymphedema, not elsewhere classified: Secondary | ICD-10-CM

## 2025-01-13 DIAGNOSIS — R310 Gross hematuria: Secondary | ICD-10-CM | POA: Insufficient documentation

## 2025-01-13 DIAGNOSIS — I10 Essential (primary) hypertension: Secondary | ICD-10-CM | POA: Diagnosis not present

## 2025-01-13 NOTE — Progress Notes (Signed)
" ° °  01/19/25 10:40 AM   Madison Benson May 24, 1941 [id number]   HPI: 84 y.o. female here for initial evaluation of gross hematuria   Acute GH x2-3 days (early Jan 2026)  - no UA data?  - did not see PCP  - continues to have ongoing UTI symptoms, urgency, frequency and dysuria  - she thinks she has UTI today-ongoing symptoms and tea colored urine  Current 40-50 pack year smoker No Fhx of GU malignancies Never seen urologist before No prior GU surgeries No prior history of nephrolithiasis, flank pain.  She does report scattered urinary tract infections  Hx of hyperlipidemia, hypertension, CAD s/p stenting, coronary calcification (seen on CT scan), active smoker, chronic shortness of breath, lymphedema, chronic back and knee pain, chronic BLE, lymphedema   PMH: Past Medical History:  Diagnosis Date   Chronic back pain    HLD (hyperlipidemia)    Hypertension    Lymphedema    Smoker     Surgical History: Past Surgical History:  Procedure Laterality Date   BACK SURGERY      Family History: Family History  Problem Relation Age of Onset   Heart attack Mother    Heart attack Father    Hypertension Other     Social History:  reports that she has been smoking cigarettes. She has a 32 pack-year smoking history. She has never used smokeless tobacco. She reports current alcohol use of about 5.0 standard drinks of alcohol per week. She reports that she does not use drugs.      Physical Exam: There were no vitals taken for this visit.   Constitutional:  Alert and oriented, No acute distress. Cardiovascular: No clubbing, cyanosis, or edema. Respiratory: Normal respiratory effort, no increased work of breathing. GI: Nondistended Skin: No rashes, bruises or suspicious lesions. Neurologic: Grossly intact, no focal deficits, moving all 4 extremities. Psychiatric: Normal mood and affect.  Laboratory Data: Component Ref Range & Units 5 mo ago  Color Colorless, Straw,  Light Yellow, Yellow, Dark Yellow Yellow  Clarity Clear Clear  Specific Gravity 1.000 - 1.030 <=1.005  pH, Urine 5.0 - 8.0 5.5  Protein, Urinalysis Negative, Trace mg/dL 30 Abnormal   Glucose, Urinalysis Negative mg/dL Negative  Ketones, Urinalysis Negative mg/dL Negative  Blood, Urinalysis Negative Moderate Abnormal   Nitrite, Urinalysis Negative Negative  Leukocyte Esterase, Urinalysis Negative Moderate Abnormal   White Blood Cells, Urinalysis None Seen, 0-3 /hpf 4-10 Abnormal   Red Blood Cells, Urinalysis None Seen, 0-3 /hpf 4-10 Abnormal   Bacteria, Urinalysis None Seen /hpf Rare Abnormal   Squamous Epithelial Cells, Urinalysis Rare, Few, None Seen /hpf Rare     Pertinent Imaging: N/A    Assessment & Plan:    Gross hematuria Assessment & Plan: Ongoing GH + UTI symptoms   - + UA today  - high risk current 40-50 pack year smoker  - Start empiric Bactrim for suspected UTI  -Send urine culture today, tailor antibiotics accordingly - If gross hematuria recurs or persist outside of treated infection-she will require formal GH workup with cystoscopy and CT urogram  Orders: -     Urinalysis, Complete -     CULTURE, URINE COMPREHENSIVE -     Nitrofurantoin  Monohyd Macro; Take 1 capsule (100 mg total) by mouth every 12 (twelve) hours for 7 days.  Dispense: 14 capsule; Refill: 0      Penne Skye, MD 01/19/2025  [facility] [address] [place], [place] [address] [phone] "

## 2025-01-13 NOTE — Progress Notes (Signed)
 "  Subjective:    Patient ID: Madison Benson, female    DOB: [birth year], 84 y.o.   MRN: [id number] Chief Complaint  Patient presents with   Follow-up    fu + ABI + B Reflux     HPI  Discussed the use of AI scribe software for clinical note transcription with the patient, who gave verbal consent to proceed.  History of Present Illness Madison Benson is an 84 year old female with chronic bilateral lower extremity lymphedema and venous insufficiency who presents for evaluation of worsening leg swelling and pain.  She has experienced intermittent bilateral lower extremity swelling and pain since 2019-2020, with symptoms fluctuating in severity. Swelling is occasionally associated with induration, erythema, and warmth, though her legs are not warm today. Symptoms are exacerbated by prolonged sitting or standing and hot weather, and improve with rest and leg elevation. She has not noted any acute changes recently.  There is longstanding discoloration of the lower legs, present for approximately 30-35 years, predating the onset of swelling. The discoloration occasionally lightens but has never resolved. She describes an area of skin with intermittent desquamation and localized pain, but denies bleeding, rapid growth, or other concerning features. She has used two prescription topical agents without improvement. No recent skin breakdown or wounds have occurred.  Venous studies have shown deep and superficial venous insufficiency in the right leg and new mild reflux in the left saphenous vein, consistent with prior studies as explained to her by her clinicians. She denies prior venous thromboembolism and has not had recent episodes of leg leakage or ulceration. She was informed that her arterial studies were normal.  She has trialed multiple sizes of compression stockings but discontinued use due to significant plantar pain attributed to a displaced bone and difficulty with footwear. She  ambulates more easily without stockings and is limited to one pair of shoes. She uses a pneumatic compression pump several times per week with assistance and receives twice-weekly leg massages with lotion, which she finds beneficial.  She has chronic bilateral knee pain and weakness, with the right knee described as out of joint and associated with greater discomfort and functional limitation. She also has neuropathy, which she feels has worsened over time, particularly with increased swelling. She notes progressive difficulty with ambulation and footwear, especially on the right side.  Periods of increased swelling and pain have coincided with personal stress and increased activity, such as caring for ill family members. She has experienced blood pressure fluctuations but is unsure of any correlation with her leg symptoms. She denies recent fever, leg wounds, or acute changes. She is able to walk limited distances and elevates her legs when possible. No recent hospitalizations for her leg condition.    Results Diagnostic Ankle Brachial Index (ABI) (01/07/2025): Right: 1.05, Left: 1.01; triphasic waveforms; good digital waveforms; normal arterial flow bilaterally. Lower Extremity Venous Reflux Study (01/07/2025): No evidence of deep vein thrombosis. Right lower extremity: deep venous insufficiency, superficial saphenous vein with valvular incompetence, new reflux in posterior saphenous vein. Left lower extremity: mild new reflux in saphenous vein. Findings consistent with prior studies from 2024 except for new reflux.   Review of Systems  All other systems reviewed and are negative.      Objective:   Physical Exam Vitals reviewed.  HENT:     Head: Normocephalic.  Cardiovascular:     Rate and Rhythm: Normal rate.  Pulmonary:     Effort: Pulmonary effort is normal.  Musculoskeletal:  Right lower leg: Edema present.     Left lower leg: Edema present.  Skin:    General: Skin is warm  and dry.  Neurological:     Mental Status: She is alert and oriented to person, place, and time.  Psychiatric:        Mood and Affect: Mood normal.        Behavior: Behavior normal.        Thought Content: Thought content normal.        Judgment: Judgment normal.     Physical Exam    BP (!) 156/84   Pulse 74   Resp 17   Ht 5' (1.524 m)   Wt 177 lb (80.3 kg) Comment: patient reported  BMI 34.57 kg/m   Past Medical History:  Diagnosis Date   Chronic back pain    HLD (hyperlipidemia)    Hypertension    Lymphedema    Smoker     Social History   Socioeconomic History   Marital status: Married    Spouse name: Not on file   Number of children: Not on file   Years of education: Not on file   Highest education level: Not on file  Occupational History   Not on file  Tobacco Use   Smoking status: Every Day    Current packs/day: 0.50    Average packs/day: 0.5 packs/day for 64.0 years (32.0 ttl pk-yrs)    Types: Cigarettes   Smokeless tobacco: Never   Tobacco comments:    7 cigarettes daily- 05/19/23  Vaping Use   Vaping status: Never Used  Substance and Sexual Activity   Alcohol use: Yes    Alcohol/week: 5.0 standard drinks of alcohol    Types: 5 Glasses of wine per week   Drug use: No   Sexual activity: Not on file  Other Topics Concern   Not on file  Social History Narrative   Not on file   Social Drivers of Health   Tobacco Use: High Risk (01/13/2025)   Patient History    Smoking Tobacco Use: Every Day    Smokeless Tobacco Use: Never    Passive Exposure: Not on file  Financial Resource Strain: Patient Declined (01/19/2024)   Received from [facility]   Overall Financial Resource Strain (CARDIA)    Difficulty of Paying Living Expenses: Patient declined  Food Insecurity: Patient Declined (01/19/2024)   Received from [facility]   Epic    Within the past 12 months, you worried that your food would run out before you got  the money to buy more.: Patient declined    Within the past 12 months, the food you bought just didn't last and you didn't have money to get more.: Patient declined  Transportation Needs: Patient Declined (01/19/2024)   Received from [facility] - Transportation    In the past 12 months, has lack of transportation kept you from medical appointments or from getting medications?: Patient declined    Lack of Transportation (Non-Medical): Patient declined  Physical Activity: Not on file  Stress: Not on file  Social Connections: Not on file  Intimate Partner Violence: Not At Risk (02/26/2023)   Humiliation, Afraid, Rape, and Kick questionnaire    Fear of Current or Ex-Partner: No    Emotionally Abused: No    Physically Abused: No    Sexually Abused: No  Depression (PHQ2-9): Not on file  Alcohol Screen: Not on file  Housing: Unknown (03/01/2024)  Received from [facility]   Epic    In the last 12 months, was there a time when you were not able to pay the mortgage or rent on time?: Patient declined    Number of Times Moved in the Last Year: Not on file    At any time in the past 12 months, were you homeless or living in a shelter (including now)?: No  Utilities: Patient Declined (01/19/2024)   Received from [facility] Utilities    Threatened with loss of utilities: Patient declined  Health Literacy: Not on file    Past Surgical History:  Procedure Laterality Date   BACK SURGERY      Family History  Problem Relation Age of Onset   Heart attack Mother    Heart attack Father    Hypertension Other     Allergies[1]     Latest Ref Rng & Units 02/27/2023    4:41 AM 02/26/2023    4:55 AM 02/26/2023   12:45 AM  CBC  WBC 4.0 - 10.5 K/uL 6.7  11.5  8.4   Hemoglobin 12.0 - 15.0 g/dL 88.8  88.1  87.0   Hematocrit 36.0 - 46.0 % 33.8  38.8  41.3   Platelets 150 - 400 K/uL 203  191  237       CMP     Component Value  Date/Time   NA 138 02/27/2023 0441   K 4.1 02/27/2023 0441   CL 107 02/27/2023 0441   CO2 23 02/27/2023 0441   GLUCOSE 170 (H) 02/27/2023 0441   BUN 43 (H) 02/27/2023 0441   CREATININE 1.25 (H) 02/27/2023 0441   CALCIUM  8.7 (L) 02/27/2023 0441   PROT 5.8 (L) 02/27/2023 0441   ALBUMIN 3.3 (L) 02/27/2023 0441   AST 23 02/27/2023 0441   ALT 20 02/27/2023 0441   ALKPHOS 78 02/27/2023 0441   BILITOT 0.5 02/27/2023 0441   GFRNONAA 43 (L) 02/27/2023 0441     VAS US  ABI WITH/WO TBI Result Date: 01/10/2025  LOWER EXTREMITY DOPPLER STUDY Patient Name:  Madison Benson  Date of Exam:   01/07/2025 Medical Rec #: [id number]             Accession #:    [id number] Date of Birth: 04-07-1941              Patient Gender: F Patient Age:   34 years Exam Location:  [facility] Procedure:      VAS US  ABI WITH/WO TBI Referring Phys: SELINDA DEW --------------------------------------------------------------------------------  High Risk Factors: Hypertension, current smoker. Other Factors: Patient complains of gout.  Performing Technologist: Donnice Charnley RVT  Examination Guidelines: A complete evaluation includes at minimum, Doppler waveform signals and systolic blood pressure reading at the level of bilateral brachial, anterior tibial, and posterior tibial arteries, when vessel segments are accessible. Bilateral testing is considered an integral part of a complete examination. Photoelectric Plethysmograph (PPG) waveforms and toe systolic pressure readings are included as required and additional duplex testing as needed. Limited examinations for reoccurring indications may be performed as noted.  ABI Findings: +---------+------------------+-----+---------+--------+ Right    Rt Pressure (mmHg)IndexWaveform Comment  +---------+------------------+-----+---------+--------+ Brachial 158                                      +---------+------------------+-----+---------+--------+ PTA      161  1.02 triphasic         +---------+------------------+-----+---------+--------+ DP       166               1.05 triphasic         +---------+------------------+-----+---------+--------+ Great Toe107               0.68                   +---------+------------------+-----+---------+--------+ +---------+------------------+-----+---------+-------+ Left     Lt Pressure (mmHg)IndexWaveform Comment +---------+------------------+-----+---------+-------+ Brachial 150                                     +---------+------------------+-----+---------+-------+ PTA      150               0.95 triphasic        +---------+------------------+-----+---------+-------+ DP       159               1.01 triphasic        +---------+------------------+-----+---------+-------+ Great Toe142               0.90                  +---------+------------------+-----+---------+-------+ +-------+-----------+-----------+------------+------------+ ABI/TBIToday's ABIToday's TBIPrevious ABIPrevious TBI +-------+-----------+-----------+------------+------------+ Right  1.05       0.68                                +-------+-----------+-----------+------------+------------+ Left   1.01       0.90                                +-------+-----------+-----------+------------+------------+  Summary: Right: Resting right ankle-brachial index is within normal range. The right toe-brachial index is abnormal.  Left: Resting left ankle-brachial index is within normal range. The left toe-brachial index is normal.  *See table(s) above for measurements and observations.  Electronically signed by Selinda Gu MD on 01/10/2025 at 8:29:31 AM.    Final        Assessment & Plan:   1. Lymphedema (Primary) Chronic venous insufficiency and lymphedema of the lower extremities Chronic venous insufficiency with deep and superficial venous insufficiency, worse on the right, and mild reflux in the left  saphenous vein. Chronic lymphedema fluctuates, exacerbated by decreased mobility, orthopedic issues, and neuropathy. No acute DVT or arterial insufficiency. Conservative management due to lack of surgical or pharmacologic cures. Venous ablation for right superficial venous reflux discussed but limited benefit expected. - Continue compression use as tolerated, considering foot pain and orthopedic issues. - Use pneumatic compression pump several times per week with assistance. - Elevate legs at home during sedentary activities. - Discussed limited benefit of venous ablation for right superficial venous reflux. - Encourage ambulation as tolerated to utilize calf muscle pump. - Continue massage with lotion using gentle upward strokes for lymphatic drainage. - Monitor for new wounds or skin breakdown and contact office if she occurs. - Follow-up in six months or earlier if symptoms worsen or new concerns arise.  2. Chronic stasis dermatitis Chronic stasis dermatitis and skin lesion of the lower extremity Longstanding stasis dermatitis with stable skin lesion for one year. No concerning features noted by dermatology or primary care. Biopsy risks outweigh benefits due to chronic edema and lesion stability. -  Continue prescribed topical lotions. - Observe skin lesion for changes in size, color, pain, or bleeding. - Defer biopsy due to lesion stability and risk of delayed wound healing. - Continue dermatology follow-up as scheduled.  3. Essential hypertension Continue antihypertensive medications as already ordered, these medications have been reviewed and there are no changes at this time.   Assessment and Plan Assessment & Plan        Medications Ordered Prior to Encounter[2]  There are no Patient Instructions on file for this visit. Return in about 6 months (around 07/13/2025) for no studies JD.   Orvin FORBES Daring, NP      [1]  Allergies Allergen Reactions   Latex Rash    After  band aid left for couple days After band aid left for couple days After band aid left for couple days    Nsaids Diarrhea and Nausea And Vomiting   Sulfamethoxazole Other (See Comments)    Other Reaction(s): abdominal pain   Sulfa Antibiotics Diarrhea and Nausea And Vomiting   Ciprofloxacin Itching   Diclofenac Sodium Diarrhea   Naproxen Diarrhea    Any anti-inflammatory causes severe diarrhea Any anti-inflammatory causes severe diarrhea Any anti-inflammatory causes severe diarrhea    Sulfamethoxazole-Trimethoprim Nausea And Vomiting and Nausea Only  [2]  Current Outpatient Medications on File Prior to Visit  Medication Sig Dispense Refill   albuterol  (VENTOLIN  HFA) 108 (90 Base) MCG/ACT inhaler Inhale 1-2 puffs into the lungs every 4 (four) hours as needed. 8 g 0   amLODipine  (NORVASC ) 2.5 MG tablet Take 5 mg by mouth at bedtime.     aspirin  81 MG EC tablet Take 81 mg by mouth daily.     Calcium -Vitamin D -Vitamin K 750-500-40 MG-UNT-MCG TABS Take 1 tablet by mouth daily.     cholecalciferol  (VITAMIN D ) 1000 units tablet Take 1,000 Units by mouth 2 (two) times daily.     cyanocobalamin  (VITAMIN B12) 1000 MCG tablet Take 1,000 mcg by mouth daily.     DULoxetine  (CYMBALTA ) 60 MG capsule Take 60 mg by mouth at bedtime.     Ferrous Sulfate  Dried 45 MG TBCR Take 65 mg by mouth daily.     losartan  (COZAAR ) 50 MG tablet Take 50 mg by mouth as directed. Take 1 tablet by mouth as needed for BP >150/80.     Magnesium  200 MG TABS Take 200 mg by mouth every other day. Taking 100 mg daily (Patient taking differently: Take 200 mg by mouth daily. Taking 125 mg daily, Magnesium  Citrate)     omeprazole (PRILOSEC) 20 MG capsule Take 20 mg by mouth daily.     Oxycodone  HCl 10 MG TABS Take 10 mg by mouth 3 (three) times daily.     polyethylene glycol (MIRALAX  / GLYCOLAX ) 17 g packet Take 17 g by mouth daily.     rosuvastatin  (CRESTOR ) 20 MG tablet Take 1 tablet (20 mg total) by mouth daily. 90 tablet 3    solifenacin (VESICARE) 5 MG tablet Take 5 mg by mouth every evening.     tiZANidine  (ZANAFLEX ) 4 MG tablet Take 4 mg by mouth 2 (two) times daily. (Patient taking differently: Take 4 mg by mouth at bedtime.)     UNABLE TO FIND Take 2 capsules by mouth daily. Med Name: [facility] MD- Lymph MD     UNABLE TO FIND Take 1 capsule by mouth at bedtime. Med Name: Preprobiotic and probiotic combination pill     Multiple Vitamins-Minerals (PRESERVISION AREDS PO) Take 1 tablet by  mouth 2 (two) times daily.     No current facility-administered medications on file prior to visit.   "

## 2025-01-13 NOTE — Assessment & Plan Note (Addendum)
 Ongoing GH + UTI symptoms   - + UA today  - high risk current 40-50 pack year smoker  - Start empiric Bactrim for suspected UTI  -Send urine culture today, tailor antibiotics accordingly - If gross hematuria recurs or persist outside of treated infection-she will require formal GH workup with cystoscopy and CT urogram

## 2025-01-19 ENCOUNTER — Ambulatory Visit (INDEPENDENT_AMBULATORY_CARE_PROVIDER_SITE_OTHER): Admitting: Urology

## 2025-01-19 VITALS — BP 178/78 | HR 75 | Ht 61.0 in | Wt 174.0 lb

## 2025-01-19 DIAGNOSIS — R310 Gross hematuria: Secondary | ICD-10-CM

## 2025-01-19 LAB — MICROSCOPIC EXAMINATION: WBC, UA: >30 /HPF — AB (ref 0–5)

## 2025-01-19 LAB — URINALYSIS, COMPLETE
Bilirubin, UA: NEGATIVE
Glucose, UA: NEGATIVE
Ketones, UA: NEGATIVE
Nitrite, UA: NEGATIVE
Specific Gravity, UA: 1.015 (ref 1.005–1.030)
Urobilinogen, Ur: 0.2 mg/dL (ref 0.2–1.0)
pH, UA: 6 (ref 5.0–7.5)

## 2025-01-19 MED ORDER — NITROFURANTOIN MONOHYD MACRO 100 MG PO CAPS: 100.0000 mg | ORAL_CAPSULE | Freq: Two times a day (BID) | ORAL | 0 refills | Status: AC

## 2025-01-19 NOTE — Addendum Note (Signed)
 Addended byBETHA CORIE PLATER on: 01/19/2025 11:02 AM   Modules accepted: Orders

## 2025-01-19 NOTE — Progress Notes (Signed)
 Madison Benson presents for an office/procedure visit. BP today is _178/78__________. She is complaint with BP medication she takes it has needed. Greater than 140/90. Provider  notified. Pt advised to talk to her cardiologist about getting back on her blood pressure medication. Pt voiced understanding.

## 2025-01-22 ENCOUNTER — Encounter (INDEPENDENT_AMBULATORY_CARE_PROVIDER_SITE_OTHER): Payer: Self-pay | Admitting: Nurse Practitioner

## 2025-01-23 LAB — CULTURE, URINE COMPREHENSIVE

## 2025-01-27 ENCOUNTER — Other Ambulatory Visit: Payer: Self-pay

## 2025-01-27 ENCOUNTER — Observation Stay

## 2025-01-27 ENCOUNTER — Observation Stay
Admission: EM | Admit: 2025-01-27 | Discharge: 2025-01-28 | Disposition: A | Discharge status: Home / Self Care | Attending: Emergency Medicine | Admitting: Emergency Medicine

## 2025-01-27 ENCOUNTER — Emergency Department

## 2025-01-27 DIAGNOSIS — R7989 Other specified abnormal findings of blood chemistry: Secondary | ICD-10-CM

## 2025-01-27 DIAGNOSIS — N3289 Other specified disorders of bladder: Secondary | ICD-10-CM

## 2025-01-27 DIAGNOSIS — I129 Hypertensive chronic kidney disease with stage 1 through stage 4 chronic kidney disease, or unspecified chronic kidney disease: Secondary | ICD-10-CM | POA: Insufficient documentation

## 2025-01-27 DIAGNOSIS — G8929 Other chronic pain: Secondary | ICD-10-CM | POA: Diagnosis present

## 2025-01-27 DIAGNOSIS — I10 Essential (primary) hypertension: Secondary | ICD-10-CM | POA: Diagnosis present

## 2025-01-27 DIAGNOSIS — N329 Bladder disorder, unspecified: Secondary | ICD-10-CM | POA: Insufficient documentation

## 2025-01-27 DIAGNOSIS — Z716 Tobacco abuse counseling: Secondary | ICD-10-CM

## 2025-01-27 DIAGNOSIS — Z7982 Long term (current) use of aspirin: Secondary | ICD-10-CM | POA: Insufficient documentation

## 2025-01-27 DIAGNOSIS — Z79899 Other long term (current) drug therapy: Secondary | ICD-10-CM | POA: Insufficient documentation

## 2025-01-27 DIAGNOSIS — N390 Urinary tract infection, site not specified: Secondary | ICD-10-CM

## 2025-01-27 DIAGNOSIS — Z91040 Latex allergy status: Secondary | ICD-10-CM | POA: Insufficient documentation

## 2025-01-27 DIAGNOSIS — R0902 Hypoxemia: Principal | ICD-10-CM

## 2025-01-27 DIAGNOSIS — M549 Dorsalgia, unspecified: Secondary | ICD-10-CM | POA: Insufficient documentation

## 2025-01-27 DIAGNOSIS — J9601 Acute respiratory failure with hypoxia: Principal | ICD-10-CM | POA: Diagnosis present

## 2025-01-27 DIAGNOSIS — N183 Chronic kidney disease, stage 3 unspecified: Secondary | ICD-10-CM | POA: Diagnosis present

## 2025-01-27 DIAGNOSIS — E785 Hyperlipidemia, unspecified: Secondary | ICD-10-CM | POA: Diagnosis present

## 2025-01-27 DIAGNOSIS — F17210 Nicotine dependence, cigarettes, uncomplicated: Secondary | ICD-10-CM | POA: Insufficient documentation

## 2025-01-27 DIAGNOSIS — Z720 Tobacco use: Secondary | ICD-10-CM | POA: Diagnosis present

## 2025-01-27 DIAGNOSIS — K219 Gastro-esophageal reflux disease without esophagitis: Secondary | ICD-10-CM | POA: Diagnosis present

## 2025-01-27 LAB — RESPIRATORY PANEL BY PCR

## 2025-01-27 LAB — COMPREHENSIVE METABOLIC PANEL WITH GFR
ALT: 42 U/L (ref 0–44)
AST: 39 U/L (ref 15–41)
Albumin: 4.5 g/dL (ref 3.5–5.0)
Alkaline Phosphatase: 132 U/L — ABNORMAL HIGH (ref 38–126)
Anion gap: 13 (ref 5–15)
BUN: 21 mg/dL (ref 8–23)
CO2: 25 mmol/L (ref 22–32)
Calcium: 9.8 mg/dL (ref 8.9–10.3)
Chloride: 106 mmol/L (ref 98–111)
Creatinine, Ser: 0.91 mg/dL (ref 0.44–1.00)
GFR, Estimated: >60 mL/min
Glucose, Bld: 129 mg/dL — ABNORMAL HIGH (ref 70–99)
Potassium: 3.6 mmol/L (ref 3.5–5.1)
Sodium: 144 mmol/L (ref 135–145)
Total Bilirubin: 0.4 mg/dL (ref 0.0–1.2)
Total Protein: 6.9 g/dL (ref 6.5–8.1)

## 2025-01-27 LAB — CBC WITH DIFFERENTIAL/PLATELET
Abs Immature Granulocytes: 0.04 10*3/uL (ref 0.00–0.07)
Basophils Absolute: 0 10*3/uL (ref 0.0–0.1)
Basophils Relative: 0 %
Eosinophils Absolute: 0.1 10*3/uL (ref 0.0–0.5)
Eosinophils Relative: 1 %
HCT: 44.5 % (ref 36.0–46.0)
Hemoglobin: 14 g/dL (ref 12.0–15.0)
Immature Granulocytes: 0 %
Lymphocytes Relative: 4 %
Lymphs Abs: 0.6 10*3/uL — ABNORMAL LOW (ref 0.7–4.0)
MCH: 29.9 pg (ref 26.0–34.0)
MCHC: 31.5 g/dL (ref 30.0–36.0)
MCV: 95.1 fL (ref 80.0–100.0)
Monocytes Absolute: 0.5 10*3/uL (ref 0.1–1.0)
Monocytes Relative: 4 %
Neutro Abs: 12 10*3/uL — ABNORMAL HIGH (ref 1.7–7.7)
Neutrophils Relative %: 91 %
Platelets: 217 10*3/uL (ref 150–400)
RBC: 4.68 MIL/uL (ref 3.87–5.11)
RDW: 13.9 % (ref 11.5–15.5)
WBC: 13.3 10*3/uL — ABNORMAL HIGH (ref 4.0–10.5)
nRBC: 0 % (ref 0.0–0.2)

## 2025-01-27 LAB — RESP PANEL BY RT-PCR (RSV, FLU A&B, COVID)  RVPGX2
Influenza A by PCR: NEGATIVE
Influenza B by PCR: NEGATIVE
Resp Syncytial Virus by PCR: NEGATIVE
SARS Coronavirus 2 by RT PCR: NEGATIVE

## 2025-01-27 LAB — BLOOD GAS, VENOUS
Acid-Base Excess: 0.3 mmol/L (ref 0.0–2.0)
Bicarbonate: 27.3 mmol/L (ref 20.0–28.0)
O2 Saturation: 52.3 %
Patient temperature: 37
pCO2, Ven: 53 mmHg (ref 44–60)
pH, Ven: 7.32 (ref 7.25–7.43)
pO2, Ven: 35 mmHg (ref 32–45)

## 2025-01-27 LAB — D-DIMER, QUANTITATIVE: D-Dimer, Quant: 1.92 ug{FEU}/mL — ABNORMAL HIGH (ref 0.00–0.50)

## 2025-01-27 LAB — TROPONIN T, HIGH SENSITIVITY: Troponin T High Sensitivity: 38 ng/L — ABNORMAL HIGH (ref 0–19)

## 2025-01-27 LAB — PROTIME-INR
INR: 0.9 (ref 0.8–1.2)
Prothrombin Time: 12.3 s (ref 11.4–15.2)

## 2025-01-27 LAB — LACTIC ACID, PLASMA: Lactic Acid, Venous: 1.4 mmol/L (ref 0.5–1.9)

## 2025-01-27 MED ORDER — IOHEXOL 350 MG/ML SOLN
75.0000 mL | Freq: Once | INTRAVENOUS | Status: AC | PRN
  Administered 2025-01-27: 75 mL via INTRAVENOUS

## 2025-01-27 MED ORDER — AMLODIPINE BESYLATE 5 MG PO TABS
5.0000 mg | ORAL_TABLET | Freq: Every day | ORAL | Status: DC
  Administered 2025-01-27: 5 mg via ORAL
  Filled 2025-01-27: qty 1

## 2025-01-27 MED ORDER — FESOTERODINE FUMARATE ER 4 MG PO TB24
4.0000 mg | ORAL_TABLET | Freq: Every evening | ORAL | Status: DC
  Administered 2025-01-27: 4 mg via ORAL
  Filled 2025-01-27 (×3): qty 1

## 2025-01-27 MED ORDER — ASPIRIN 81 MG PO TBEC
81.0000 mg | DELAYED_RELEASE_TABLET | Freq: Every day | ORAL | Status: DC
  Administered 2025-01-28: 81 mg via ORAL
  Filled 2025-01-27: qty 1

## 2025-01-27 MED ORDER — NICOTINE 14 MG/24HR TD PT24
14.0000 mg | MEDICATED_PATCH | Freq: Every day | TRANSDERMAL | Status: DC
  Filled 2025-01-27 (×2): qty 1

## 2025-01-27 MED ORDER — FLUTICASONE PROPIONATE 50 MCG/ACT NA SUSP
2.0000 | Freq: Every day | NASAL | Status: DC
  Administered 2025-01-27: 2 via NASAL
  Filled 2025-01-27: qty 16

## 2025-01-27 MED ORDER — ONDANSETRON HCL 4 MG/2ML IJ SOLN: 4.0000 mg | Freq: Once | INTRAMUSCULAR | Status: DC

## 2025-01-27 MED ORDER — ONDANSETRON HCL 4 MG PO TABS: 4.0000 mg | ORAL_TABLET | Freq: Four times a day (QID) | ORAL | Status: DC | PRN

## 2025-01-27 MED ORDER — ALBUTEROL SULFATE (2.5 MG/3ML) 0.083% IN NEBU: 2.5000 mg | INHALATION_SOLUTION | RESPIRATORY_TRACT | Status: DC | PRN

## 2025-01-27 MED ORDER — ONDANSETRON HCL 4 MG/2ML IJ SOLN: 4.0000 mg | Freq: Four times a day (QID) | INTRAMUSCULAR | Status: DC | PRN

## 2025-01-27 MED ORDER — SODIUM CHLORIDE 0.9 % IV SOLN
1.0000 g | INTRAVENOUS | Status: DC
  Administered 2025-01-27: 1 g via INTRAVENOUS
  Filled 2025-01-27 (×2): qty 10

## 2025-01-27 MED ORDER — OXYCODONE HCL 5 MG PO TABS
10.0000 mg | ORAL_TABLET | Freq: Three times a day (TID) | ORAL | Status: DC | PRN
  Administered 2025-01-27 – 2025-01-28 (×2): 10 mg via ORAL
  Filled 2025-01-27 (×2): qty 2

## 2025-01-27 MED ORDER — OYSTER SHELL CALCIUM/D3 500-5 MG-MCG PO TABS
1.0000 | ORAL_TABLET | Freq: Every day | ORAL | Status: DC
  Administered 2025-01-28: 1 via ORAL
  Filled 2025-01-27: qty 1

## 2025-01-27 MED ORDER — ENOXAPARIN SODIUM 40 MG/0.4ML IJ SOSY
40.0000 mg | PREFILLED_SYRINGE | INTRAMUSCULAR | Status: DC
  Administered 2025-01-27: 40 mg via SUBCUTANEOUS
  Filled 2025-01-27: qty 0.4

## 2025-01-27 MED ORDER — ROSUVASTATIN CALCIUM 10 MG PO TABS
20.0000 mg | ORAL_TABLET | Freq: Every day | ORAL | Status: DC
  Filled 2025-01-27: qty 2

## 2025-01-27 MED ORDER — NICOTINE POLACRILEX 2 MG MT GUM
2.0000 mg | CHEWING_GUM | OROMUCOSAL | Status: DC | PRN
  Administered 2025-01-27: 2 mg via ORAL
  Filled 2025-01-27 (×2): qty 1

## 2025-01-27 MED ORDER — DULOXETINE HCL 60 MG PO CPEP: 60.0000 mg | ORAL_CAPSULE | Freq: Every day | ORAL | Status: DC

## 2025-01-27 MED ORDER — IPRATROPIUM-ALBUTEROL 0.5-2.5 (3) MG/3ML IN SOLN
6.0000 mL | Freq: Once | RESPIRATORY_TRACT | Status: AC
  Administered 2025-01-27: 6 mL via RESPIRATORY_TRACT
  Filled 2025-01-27: qty 6

## 2025-01-27 MED ORDER — IPRATROPIUM-ALBUTEROL 0.5-2.5 (3) MG/3ML IN SOLN
3.0000 mL | Freq: Four times a day (QID) | RESPIRATORY_TRACT | Status: DC
  Administered 2025-01-27 – 2025-01-28 (×4): 3 mL via RESPIRATORY_TRACT
  Filled 2025-01-27 (×4): qty 3

## 2025-01-27 MED ORDER — METHYLPREDNISOLONE SODIUM SUCC 125 MG IJ SOLR
125.0000 mg | Freq: Once | INTRAMUSCULAR | Status: AC
  Administered 2025-01-27: 125 mg via INTRAVENOUS
  Filled 2025-01-27: qty 2

## 2025-01-27 NOTE — Assessment & Plan Note (Signed)
 Continue home crestor 

## 2025-01-27 NOTE — Assessment & Plan Note (Addendum)
 Troponin 38 on presentation  No active chest pain  Noted coronary calcifications on CT 01/2023 followed by outpatient cardiology ( normal stress test 06/2024)  EKG stable  Suspect minimal to mild heart strain  Trend troponin  Further evaluation if there is a significant uptrend

## 2025-01-27 NOTE — ED Notes (Signed)
"  Placed on 2L NC  "

## 2025-01-27 NOTE — Assessment & Plan Note (Addendum)
 Pt reports being recently diagnosed with UTI with ross hematuria (see 01/19/25 Urology note)  S/p 7 day course of bactrim with urology No reported hematuria  On IV rocephin  in setting COPD treatment Monitor

## 2025-01-27 NOTE — ED Triage Notes (Addendum)
 Pt to ED from [facility] independent living for shob, received duoneb PTA. RA sPo2 82% . Cbg 62. Pt on antibiotics for UTI

## 2025-01-27 NOTE — Assessment & Plan Note (Addendum)
 Decompensated respiratory status now requiring 4 L nasal cannula to keep O2 sats greater than 92% Noted longstanding smoking history without formal diagnosis of COPD + SOB, cought + wheezing on exam  No known history of hypoxia requiring supplemental O2  Covid, flu, RSV negative  Chest x-ray grossly within normal limits-Will check CT of the chest to better assess given overall presentation Will empirically cover for COPD exacerbation with IV Solu-Medrol , DuoNebs, IV Rocephin , supplemental oxygen Add on resp panel  Check d dimer in setting of tobacco abuse and relative immobility

## 2025-01-27 NOTE — Assessment & Plan Note (Signed)
 Cr 0.9 w/ GFR >60 today Monitor

## 2025-01-27 NOTE — Assessment & Plan Note (Signed)
 1/2 PPD smoker  Discussed cessation at length  Nicotine  patch  Nicorette  gum  Follow

## 2025-01-27 NOTE — ED Notes (Signed)
 Patient transported to CT

## 2025-01-27 NOTE — ED Provider Notes (Signed)
 "  [facility] Provider Note    Event Date/Time   First MD Initiated Contact with Patient 01/27/25 [phone]     (approximate)   History   Shortness of Breath   HPI  Madison Benson is a 84 y.o. female past medical history significant for lymphedema, chronic venous stasis, hypertension, hyperlipidemia, ongoing tobacco use, who presents to the emergency department with shortness of breath and not feeling well.  States that she was feeling fine yesterday.  Does state that she has been having ongoing urinary tract symptoms with urinary urgency and frequency with started on antibiotics yesterday for urinary tract infection.  Today at 5:30 in the morning she started to feel poorly, shaky and shortness of breath.  Was having some chest tightness at that time.  Does state that she feels nauseous but no episodes of vomiting.  Denies any significant cough.  Denies history of DVT or PE.  When EMS arrived patient was hypoxic on room air to 82%.  States that she got significantly shaking when trying to ambulate.  Was complaining of some chest discomfort at that time.  Does not wear oxygen at baseline.  Does have ongoing tobacco use.  No formal diagnosis of COPD  On chart review patient has a history of hypertension with blood pressures that are significantly elevated at 180/82 on prior visits takes multiple blood pressure medications, coronary calcifications noted on CT scan predominantly in the LAD region, hyperlipidemia, chronic shortness of breath with an EF of 60 to 65%, chronic lymphedema with prior evaluation and ruling out of DVT and chronic venous stasis to discoloration to bilateral lower extremities.     Physical Exam   Triage Vital Signs: ED Triage Vitals  Encounter Vitals Group     BP 01/27/25 0950 (!) 162/133     Girls Systolic BP Percentile --      Girls Diastolic BP Percentile --      Boys Systolic BP Percentile --      Boys Diastolic BP Percentile --       Pulse Rate 01/27/25 0950 88     Resp 01/27/25 0950 (!) 26     Temp 01/27/25 0950 97.7 F (36.5 C)     Temp Source 01/27/25 0950 Oral     SpO2 --      Weight --      Height --      Head Circumference --      Peak Flow --      Pain Score 01/27/25 0948 0     Pain Loc --      Pain Education --      Exclude from Growth Chart --     Most recent vital signs: Vitals:   01/27/25 1007 01/27/25 1015  BP:    Pulse:  91  Resp:  (!) 26  Temp:    SpO2: 92% 96%    Physical Exam Constitutional:      Appearance: She is well-developed.  HENT:     Head: Atraumatic.  Eyes:     Conjunctiva/sclera: Conjunctivae normal.  Cardiovascular:     Rate and Rhythm: Regular rhythm.  Pulmonary:     Effort: Tachypnea and accessory muscle usage present. No respiratory distress.     Breath sounds: Wheezing and rhonchi present.     Comments: 85% on room air, placed on 4 L nasal cannula with improvement to 92% Abdominal:     General: There is no distension.  Musculoskeletal:  General: Normal range of motion.     Cervical back: Normal range of motion.     Comments: Chronic venous stasis changes to bilateral lower extremities which patient states is her baseline.  Right lower extremity is increased in size when compared to the left lower extremity which she also states is her baseline.  +2 DP pulses that are equal bilaterally.  No significant pitting edema.  Skin:    General: Skin is warm.     Capillary Refill: Capillary refill takes less than 2 seconds.  Neurological:     General: No focal deficit present.     Mental Status: She is alert. Mental status is at baseline.  Psychiatric:        Mood and Affect: Mood is anxious.     IMPRESSION / MDM / ASSESSMENT AND PLAN / ED COURSE  I reviewed the triage vital signs and the nursing notes.  On arrival patient is hypoxic on room air which is new for her.  Differential diagnosis including viral illness including COVID/influenza, pneumonia, ACS,  anemia, dehydration, electrolyte abnormality, hypoglycemia, pulmonary embolism, CHF  Blood cultures, lactic acid obtained.  Given her normotension and good perfusion do not feel that IV fluids is necessary at this time.   EKG  I, Clotilda Punter, the attending physician, personally viewed and interpreted this ECG.  EKG is reading with atrial fibrillation however disagree is more likely artifact.  Underlying right bundle branch block which appears to be stable and present previously.  Signs of LVH.  QRS 133, QTc 462.  No tachycardic or bradycardic dysrhythmias while on cardiac telemetry.  RADIOLOGY I independently reviewed imaging, my interpretation of imaging: Chest x-ray -no signs of pneumonia or pulmonary edema.  LABS (all labs ordered are listed, but only abnormal results are displayed) Labs interpreted as -    Labs Reviewed  COMPREHENSIVE METABOLIC PANEL WITH GFR - Abnormal; Notable for the following components:      Result Value   Glucose, Bld 129 (*)    Alkaline Phosphatase 132 (*)    All other components within normal limits  CBC WITH DIFFERENTIAL/PLATELET - Abnormal; Notable for the following components:   WBC 13.3 (*)    Neutro Abs 12.0 (*)    Lymphs Abs 0.6 (*)    All other components within normal limits  TROPONIN T, HIGH SENSITIVITY - Abnormal; Notable for the following components:   Troponin T High Sensitivity 38 (*)    All other components within normal limits  RESP PANEL BY RT-PCR (RSV, FLU A&B, COVID)  RVPGX2  CULTURE, BLOOD (ROUTINE X 2)  CULTURE, BLOOD (ROUTINE X 2)  LACTIC ACID, PLASMA  PROTIME-INR  BLOOD GAS, VENOUS  LACTIC ACID, PLASMA  URINALYSIS, W/ REFLEX TO CULTURE (INFECTION SUSPECTED)     MDM  Started on DuoNeb treatments, given IV Solu-Medrol   Clinical Course as of 01/27/25 1123  Thu Jan 27, 2025  1015 VBG with no significant hypercarbia.  Does have leukocytosis of 13.3.  No significant anemia.  Platelets are normal.  Does have a left  shift. [SM]    Clinical Course User Index [SM] Punter Clotilda, MD   No obvious findings of pneumonia on chest x-ray.  COVID influenza testing are negative.  Lactic acid normal at 1.4.  Normal glucose of 129.  VBG without significant hypercarbia or acidosis.  Troponin mildly elevated at 38.  Getting a second troponin now.  Consulted hospitalist for admission for acute hypoxic respiratory failure  PROCEDURES:  Critical Care performed: yes  .  Critical Care  Performed by: Suzanne Kirsch, MD Authorized by: Suzanne Kirsch, MD   Critical care provider statement:    Critical care time (minutes):  30   Critical care time was exclusive of:  Separately billable procedures and treating other patients   Critical care was necessary to treat or prevent imminent or life-threatening deterioration of the following conditions:  Respiratory failure   Critical care was time spent personally by me on the following activities:  Development of treatment plan with patient or surrogate, discussions with consultants, evaluation of patient's response to treatment, examination of patient, ordering and review of laboratory studies, ordering and review of radiographic studies, ordering and performing treatments and interventions, pulse oximetry, re-evaluation of patient's condition and review of old charts   Care discussed with: admitting provider     Patient's presentation is most consistent with acute presentation with potential threat to life or bodily function.   MEDICATIONS ORDERED IN ED: Medications  ondansetron  (ZOFRAN ) injection 4 mg (has no administration in time range)  methylPREDNISolone  sodium succinate (SOLU-MEDROL ) 125 mg/2 mL injection 125 mg (has no administration in time range)  ipratropium-albuterol  (DUONEB) 0.5-2.5 (3) MG/3ML nebulizer solution 6 mL (6 mLs Nebulization Given 01/27/25 1008)    FINAL CLINICAL IMPRESSION(S) / ED DIAGNOSES   Final diagnoses:  Hypoxia     Rx / DC Orders   ED  Discharge Orders     None        Note:  This document was prepared using Dragon voice recognition software and may include unintentional dictation errors.   Suzanne Kirsch, MD 01/27/25 1123  "

## 2025-01-27 NOTE — H&P (Addendum)
 " [facility] Telemedicine History and Physical  Referring Provider: Mumma MD  Telemedicine Provider: Eldonna MD  Provider Location: [facility] [place]  Patient Location: [facility]  Referring Diagnosis: Acute resp failure w/ hypoxia  Patient Name and DOB verified: yes Patient consented to Telemedicine Evaluation:yes RN virtual assistant: Powell Seip RN  Video encounter time and date:01/27/25  1300   Additional Information:  Transfusion: no  CPAP/BIPAP: no  O2:4L  Foley: no    PatientJancie Benson FMW:[id number] DOB: 1940-12-31 DOA: 01/27/2025 DOS: the patient was seen and examined on 01/27/2025 PCP: Valora Lynwood FALCON, MD  Patient coming from: Home  Chief Complaint:  Chief Complaint  Patient presents with   Shortness of Breath   HPI: Madison Benson is a 84 y.o. female with medical history significant of chronic back pain, HLD, HTN. Lymphedema, tobacco abuse presenting w/ acute resp failure w/ hypoxia.  Patient reports increased work of breathing over the past 4 to 5 days.  Patient reports longstanding history of chronic shortness of breath.  1/2 pack/day smoker.  Patient denies any known history or diagnosis of COPD, obstructive lung disease or asthma in the past.  Patient is she states she has seen pulmonology in the past without this diagnosis despite workup?  No fevers or chills.  Mild rhinorrhea and nasal congestion.  Positive sneezing.  Patient states she has had chronic allergy issues since moving to [place] and over the past 2 to 3 years.  Has had some mild decompensation.  No belly pain or diarrhea.  No focal hemiparesis or confusion.  Has had a home inhaler that has not improved symptoms.  No reported orthopnea or PND.  Has chronic lower extremity swelling and venous stasis changes.  Still smoking 1/2 pack/day.  Patient is not ready to quit despite aggressive counseling.  Presents to the ER to temper 97.7, heart rate 50s to 90s, respiration in the 20s, blood pressure 150s to 180s over  70s to 90s.  Initially satting in the upper 80s on room air.  Transition to 4 L nasal cannula to keep O2 sats greater than 92%.  White count 13.3, hemoglobin 14, platelets 217.  Creatinine 0.91.  Troponin 38.  EKG showing atrial fibrillation.  Chest x-ray grossly within normal limits.  In the ER, given DuoNebs, IV Solu-Medrol  and supplemental oxygen. Review of Systems: As mentioned in the history of present illness. All other systems reviewed and are negative. Past Medical History:  Diagnosis Date   Chronic back pain    HLD (hyperlipidemia)    Hypertension    Lymphedema    Smoker    Past Surgical History:  Procedure Laterality Date   BACK SURGERY     Social History:  reports that she has been smoking cigarettes. She has a 32 pack-year smoking history. She has never used smokeless tobacco. She reports current alcohol use of about 5.0 standard drinks of alcohol per week. She reports that she does not use drugs.  Allergies[1]  Family History  Problem Relation Age of Onset   Heart attack Mother    Heart attack Father    Hypertension Other     Prior to Admission medications  Medication Sig Start Date End Date Taking? Authorizing Provider  albuterol  (VENTOLIN  HFA) 108 (90 Base) MCG/ACT inhaler Inhale 1-2 puffs into the lungs every 4 (four) hours as needed. 02/27/23   Jhonny Calvin NOVAK, MD  amLODipine  (NORVASC ) 2.5 MG tablet Take 5 mg by mouth at bedtime. 04/22/22   [provider]  aspirin  81 MG  EC tablet Take 81 mg by mouth daily.    [provider]  Calcium -Vitamin D -Vitamin K 750-500-40 MG-UNT-MCG TABS Take 1 tablet by mouth daily.    [provider]  cholecalciferol  (VITAMIN D ) 1000 units tablet Take 1,000 Units by mouth 2 (two) times daily.    [provider]  cyanocobalamin  (VITAMIN B12) 1000 MCG tablet Take 1,000 mcg by mouth daily.    [provider]  DULoxetine  (CYMBALTA ) 60 MG capsule Take 60 mg by mouth at bedtime.    [provider]  Emollient (PENTRAVAN PLUS) CREA Lidocaine 5%, Diclofenac 3%, Cyclobenzaprine 2%, Gabapentin 2%, and Ketamine 10%. Apply 1-2 pumps to affected area on feet 3-4 times per day (Max of 8 pumps per day) 01/04/25   [provider]  Ferrous Sulfate  Dried 45 MG TBCR Take 65 mg by mouth daily.    [provider]  losartan  (COZAAR ) 50 MG tablet Take 50 mg by mouth as directed. Take 1 tablet by mouth as needed for BP >150/80.    [provider]  Magnesium  200 MG TABS Take 200 mg by mouth every other day. Taking 100 mg daily Patient taking differently: Take 200 mg by mouth daily. Taking 125 mg daily, Magnesium  Citrate    [provider]  Multiple Vitamins-Minerals (PRESERVISION AREDS PO) Take 1 tablet by mouth 2 (two) times daily.    [provider]  omeprazole (PRILOSEC) 20 MG capsule Take 20 mg by mouth daily.    [provider]  Oxycodone  HCl 10 MG TABS Take 10 mg by mouth 3 (three) times daily.    [provider]  polyethylene glycol (MIRALAX  / GLYCOLAX ) 17 g packet Take 17 g by mouth daily.    [provider]  rosuvastatin  (CRESTOR ) 20 MG tablet Take 1 tablet (20 mg total) by mouth daily. 10/23/23   Gerard Frederick, NP  solifenacin (VESICARE) 5 MG tablet Take 5 mg by mouth every evening. 06/28/22   [provider]  UNABLE TO FIND Take 2 capsules by mouth daily. Med Name: [facility] MD- Lymph MD    [provider]  UNABLE TO FIND Take 1 capsule by mouth at bedtime. Med Name: Preprobiotic and probiotic combination pill    [provider]    Physical Exam: Vitals:   01/27/25 1007 01/27/25 1015 01/27/25 1130 01/27/25 1230  BP:   (!) 157/79 (!) 152/73  Pulse:  91 95 95  Resp:  (!) 26 20 14   Temp:      TempSrc:      SpO2: 92% 96% 92% 93%   Physical Exam Vitals reviewed. Nursing note reviewed: Portions of physical exam including cardiac, pulmonary abdominal and skin performed by Powell Ravens RN. Constitutional:      Appearance: She is obese.  HENT:     Head: Normocephalic and atraumatic.     Nose: Nose normal.     Mouth/Throat:     Mouth: Mucous membranes are moist.  Eyes:     Pupils: Pupils are equal, round, and reactive to light.  Cardiovascular:     Rate and Rhythm: Normal rate and regular rhythm.  Pulmonary:     Effort: Pulmonary effort is normal.     Breath sounds: Wheezing present.  Abdominal:     General: Bowel sounds are normal.  Skin:    General: Skin is warm.  Neurological:     General: No focal deficit present.  Psychiatric:        Mood and Affect:  Mood normal.     Data Reviewed:  There are no new results to review at this time.  DG Chest Port 1 View EXAM: 1 VIEW(S) XRAY OF THE CHEST 01/27/2025 10:08:00 AM  COMPARISON: 02/27/23.  CLINICAL HISTORY: Questionable sepsis; evaluate for abnormality.  FINDINGS:  LUNGS AND PLEURA: Low lung volumes. No focal pulmonary opacity. No pleural effusion. No pneumothorax.  HEART AND MEDIASTINUM: Aortic atherosclerosis. No acute abnormality of the cardiac and mediastinal silhouettes.  BONES AND SOFT TISSUES: Severe right shoulder arthropathy. Postsurgical change of lumbar spine, partially included. Left shoulder arthroplasty.  IMPRESSION: 1. No acute findings.  Electronically signed by: Waddell Calk MD 01/27/2025 10:59 AM EST RP Workstation: [id number]  Lab Results  Component Value Date   WBC 13.3 (H) 01/27/2025   HGB 14.0 01/27/2025   HCT 44.5 01/27/2025   MCV 95.1 01/27/2025   PLT 217 01/27/2025   Last metabolic panel Lab Results  Component Value Date   GLUCOSE 129 (H) 01/27/2025   NA 144 01/27/2025   K 3.6 01/27/2025   CL 106 01/27/2025   CO2 25 01/27/2025   BUN 21 01/27/2025   CREATININE 0.91 01/27/2025   GFRNONAA >60 01/27/2025   CALCIUM  9.8 01/27/2025   PHOS 3.8 02/27/2023   PROT 6.9 01/27/2025   ALBUMIN 4.5 01/27/2025   BILITOT 0.4 01/27/2025   ALKPHOS 132 (H)  01/27/2025   AST 39 01/27/2025   ALT 42 01/27/2025   ANIONGAP 13 01/27/2025    Assessment and Plan: * Acute respiratory failure with hypoxia (HCC) Decompensated respiratory status now requiring 4 L nasal cannula to keep O2 sats greater than 92% Noted longstanding smoking history without formal diagnosis of COPD No known history of hypoxia requiring supplemental O2  Covid, flu, RSV negative  Chest x-ray grossly within normal limits-Will check CT of the chest to better assess given overall presentation Will empirically cover for COPD exacerbation with IV Solu-Medrol , DuoNebs, IV Rocephin , supplemental oxygen Add on resp panel     Elevated troponin Troponin 38 on presentation  No active chest pain  Noted coronary calcifications on CT 01/2023 followed by outpatient cardiology ( normal stress test 06/2024)  EKG stable  Suspect minimal to mild heart strain  Trend troponin  Further evaluation if there is a significant uptrend    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  Discussed cessation at length  Nicotine  patch  Nicorette  gum  Follow    History of UTI (urinary tract infection) Pt reports being recently diagnosed with UTI with ross hematuria (see 01/19/25 Urology note)  S/p 7 day course of bactrim with urology No reported hematuria  On IV rocephin  in setting COPD treatment Monitor    Dyslipidemia Continue home crestor     CKD (chronic kidney disease) stage 3, GFR 30-59 ml/min (HCC) Cr 0.9 w/ GFR >60 today Monitor   GERD (gastroesophageal reflux disease) PPI   Chronic back pain Appears stable without decompensation  Cont home cymbalta  and oxycodone     Essential hypertension BP stable  Titrate home regimen        Advance Care Planning:   Code Status: Full Code   Consults: none   Family Communication: Husband at the bedside   Severity of Illness: The appropriate patient status for this patient is OBSERVATION. Observation status is judged to be reasonable and  necessary in order to provide the required intensity of service to ensure the patient's safety. The patient's presenting symptoms, physical exam findings, and initial radiographic and laboratory data in the context of their medical  condition is felt to place them at decreased risk for further clinical deterioration. Furthermore, it is anticipated that the patient will be medically stable for discharge from the hospital within 2 midnights of admission.   Author: Elspeth JINNY Masters, MD 01/27/2025 1:18 PM  For on call review [email or web address].      [1]  Allergies Allergen Reactions   Latex Rash    After band aid left for couple days After band aid left for couple days After band aid left for couple days    Nsaids Diarrhea and Nausea And Vomiting   Sulfamethoxazole Other (See Comments)    Other Reaction(s): abdominal pain   Sulfa Antibiotics Diarrhea and Nausea And Vomiting   Ciprofloxacin Itching   Diclofenac Sodium Diarrhea   Naproxen Diarrhea    Any anti-inflammatory causes severe diarrhea Any anti-inflammatory causes severe diarrhea Any anti-inflammatory causes severe diarrhea    Sulfamethoxazole-Trimethoprim Nausea And Vomiting and Nausea Only   "

## 2025-01-27 NOTE — Assessment & Plan Note (Signed)
 BP stable Titrate home regimen

## 2025-01-27 NOTE — Assessment & Plan Note (Signed)
 Appears stable without decompensation  Cont home cymbalta  and oxycodone 

## 2025-01-27 NOTE — Assessment & Plan Note (Signed)
 PPI

## 2025-01-28 ENCOUNTER — Other Ambulatory Visit: Payer: Self-pay

## 2025-01-28 ENCOUNTER — Observation Stay

## 2025-01-28 DIAGNOSIS — K219 Gastro-esophageal reflux disease without esophagitis: Secondary | ICD-10-CM

## 2025-01-28 DIAGNOSIS — R0902 Hypoxemia: Principal | ICD-10-CM

## 2025-01-28 DIAGNOSIS — E785 Hyperlipidemia, unspecified: Secondary | ICD-10-CM | POA: Diagnosis not present

## 2025-01-28 DIAGNOSIS — N135 Crossing vessel and stricture of ureter without hydronephrosis: Secondary | ICD-10-CM

## 2025-01-28 DIAGNOSIS — N3289 Other specified disorders of bladder: Secondary | ICD-10-CM

## 2025-01-28 DIAGNOSIS — R7989 Other specified abnormal findings of blood chemistry: Secondary | ICD-10-CM

## 2025-01-28 DIAGNOSIS — I10 Essential (primary) hypertension: Secondary | ICD-10-CM

## 2025-01-28 DIAGNOSIS — Z720 Tobacco use: Secondary | ICD-10-CM | POA: Diagnosis not present

## 2025-01-28 DIAGNOSIS — J9601 Acute respiratory failure with hypoxia: Secondary | ICD-10-CM | POA: Diagnosis not present

## 2025-01-28 LAB — CBC
HCT: 37 % (ref 36.0–46.0)
Hemoglobin: 11.9 g/dL — ABNORMAL LOW (ref 12.0–15.0)
MCH: 30.1 pg (ref 26.0–34.0)
MCHC: 32.2 g/dL (ref 30.0–36.0)
MCV: 93.4 fL (ref 80.0–100.0)
Platelets: 177 10*3/uL (ref 150–400)
RBC: 3.96 MIL/uL (ref 3.87–5.11)
RDW: 14 % (ref 11.5–15.5)
WBC: 9.4 10*3/uL (ref 4.0–10.5)
nRBC: 0 % (ref 0.0–0.2)

## 2025-01-28 LAB — COMPREHENSIVE METABOLIC PANEL WITH GFR
ALT: 26 U/L (ref 0–44)
AST: 23 U/L (ref 15–41)
Albumin: 3.6 g/dL (ref 3.5–5.0)
Alkaline Phosphatase: 104 U/L (ref 38–126)
Anion gap: 10 (ref 5–15)
BUN: 25 mg/dL — ABNORMAL HIGH (ref 8–23)
CO2: 24 mmol/L (ref 22–32)
Calcium: 9 mg/dL (ref 8.9–10.3)
Chloride: 106 mmol/L (ref 98–111)
Creatinine, Ser: 0.93 mg/dL (ref 0.44–1.00)
GFR, Estimated: >60 mL/min
Glucose, Bld: 147 mg/dL — ABNORMAL HIGH (ref 70–99)
Potassium: 3.9 mmol/L (ref 3.5–5.1)
Sodium: 139 mmol/L (ref 135–145)
Total Bilirubin: 0.3 mg/dL (ref 0.0–1.2)
Total Protein: 5.7 g/dL — ABNORMAL LOW (ref 6.5–8.1)

## 2025-01-28 MED ORDER — AZITHROMYCIN 250 MG PO TABS
250.0000 mg | ORAL_TABLET | Freq: Every day | ORAL | 0 refills | Status: AC
  Filled 2025-01-28: qty 4, 4d supply, fill #0

## 2025-01-28 MED ORDER — AMOXICILLIN-POT CLAVULANATE 875-125 MG PO TABS
1.0000 | ORAL_TABLET | Freq: Two times a day (BID) | ORAL | 0 refills | Status: AC
  Filled 2025-01-28: qty 8, 4d supply, fill #0

## 2025-01-28 MED ORDER — IPRATROPIUM-ALBUTEROL 0.5-2.5 (3) MG/3ML IN SOLN: 3.0000 mL | Freq: Two times a day (BID) | RESPIRATORY_TRACT | Status: DC

## 2025-01-28 MED ORDER — IOHEXOL 300 MG/ML  SOLN
125.0000 mL | Freq: Once | INTRAMUSCULAR | Status: AC | PRN
  Administered 2025-01-28: 125 mL via INTRAVENOUS

## 2025-01-28 MED ORDER — AZITHROMYCIN 250 MG PO TABS
500.0000 mg | ORAL_TABLET | Freq: Every day | ORAL | Status: DC
  Administered 2025-01-28: 500 mg via ORAL
  Filled 2025-01-28: qty 2

## 2025-01-28 MED ORDER — NICOTINE POLACRILEX 2 MG MT GUM
2.0000 mg | CHEWING_GUM | OROMUCOSAL | 0 refills | Status: AC | PRN
  Filled 2025-01-28: qty 50, 5d supply, fill #0

## 2025-01-28 NOTE — Progress Notes (Signed)
 Overnight update:  CTA obtained showing:  PNA- adding on azithromycin  in setting of COPD treatment  Imaging also showed hydronephrosis and intrarenal calyceal dilation- urology consulted w/ planned CT hematuria

## 2025-01-28 NOTE — Care Management Obs Status (Signed)
 MEDICARE OBSERVATION STATUS NOTIFICATION   Patient Details  Name: Madison Benson MRN: [id number] Date of Birth: 1941/05/12   Medicare Observation Status Notification Given:  Yes    Kaesen Rodriguez 01/28/2025, 1:18 PM

## 2025-01-28 NOTE — Evaluation (Signed)
 Occupational Therapy Evaluation Patient Details Name: Madison Benson MRN: [id number] DOB: [birth year] Today's Date: 01/28/2025   History of Present Illness   84 y/o female presented to ED on 01/27/25 for SOB. Admitted for acute respiratory failure with hypoxia. PMH: chronic back pain, HTN, hx of cervical fusion and lumbar fusion, lymphedema, tobacco abuse     Clinical Impressions Patient presenting with decreased Ind in self care,balance, functional mobility, transfers, endurance, and safety awareness. Pt is able to remain on RA this session with O2 saturation at 93% or better. Pt lives at [facility] with husband. Pt performs bed mobility with supervision to min A. Pt stands and ambulates with min A. Patient needing some assistance with LB clothing management. She is able to void and perform hygiene while seated on commode without assist. Pt stands for hand hygiene and sink and then returns to bed in same manner as above with RW.  Patient will benefit from acute OT to increase overall independence in the areas of ADLs, functional mobility, and safety awareness in order to safely discharge.     If plan is discharge home, recommend the following:   A little help with walking and/or transfers;A little help with bathing/dressing/bathroom;Assistance with cooking/housework;Assist for transportation;Help with stairs or ramp for entrance     Functional Status Assessment   Patient has had a recent decline in their functional status and demonstrates the ability to make significant improvements in function in a reasonable and predictable amount of time.     Equipment Recommendations   None recommended by OT      Precautions/Restrictions   Precautions Precautions: Fall Recall of Precautions/Restrictions: Intact     Mobility Bed Mobility Overal bed mobility: Needs Assistance Bed Mobility: Supine to Sit, Sit to Supine     Supine to sit: Supervision Sit to supine:  Min assist        Transfers Overall transfer level: Needs assistance Equipment used: Rolling walker (2 wheels) Transfers: Sit to/from Stand Sit to Stand: Supervision, Min assist                  Balance Overall balance assessment: Needs assistance Sitting-balance support: Feet supported, No upper extremity supported Sitting balance-Leahy Scale: Good     Standing balance support: Bilateral upper extremity supported, During functional activity Standing balance-Leahy Scale: Fair                             ADL either performed or assessed with clinical judgement   ADL Overall ADL's : Needs assistance/impaired     Grooming: Wash/dry hands;Standing;Contact guard assist                   Toilet Transfer: Minimal assistance;Rolling walker (2 wheels);Regular Toilet;Grab bars   Toileting- Clothing Manipulation and Hygiene: Moderate assistance Toileting - Clothing Manipulation Details (indicate cue type and reason): assistance for clothing management             Vision Patient Visual Report: No change from baseline              Pertinent Vitals/Pain Pain Assessment Pain Assessment: Faces Faces Pain Scale: Hurts little more Pain Location: neck/back Pain Descriptors / Indicators: Discomfort, Guarding Pain Intervention(s): Monitored during session     Extremity/Trunk Assessment Upper Extremity Assessment Upper Extremity Assessment: Generalized weakness   Lower Extremity Assessment Lower Extremity Assessment: Generalized weakness   Cervical / Trunk Assessment Cervical / Trunk Assessment: Kyphotic   Communication  Communication Communication: No apparent difficulties   Cognition Arousal: Alert Behavior During Therapy: WFL for tasks assessed/performed Cognition: No apparent impairments                               Following commands: Intact                  Home Living Family/patient expects to be discharged to::  Private residence Living Arrangements: Spouse/significant other Available Help at Discharge: Family;Available 24 hours/day Type of Home: Independent living facility                       Home Equipment: Rollator (4 wheels);Electric scooter          Prior Functioning/Environment Prior Level of Function : Independent/Modified Independent             Mobility Comments: uses rollator for mobility in the home and electric scooter outside of the home ADLs Comments: mod I with self care needs    OT Problem List: Decreased strength;Impaired balance (sitting and/or standing);Decreased safety awareness;Decreased activity tolerance   OT Treatment/Interventions: Self-care/ADL training;Therapeutic exercise;Patient/family education;Balance training;Energy conservation;Therapeutic activities;DME and/or AE instruction      OT Goals(Current goals can be found in the care plan section)   Acute Rehab OT Goals Patient Stated Goal: to go home before snow storm OT Goal Formulation: With patient/family Time For Goal Achievement: 02/11/25 Potential to Achieve Goals: Fair ADL Goals Pt Will Perform Grooming: with modified independence;standing Pt Will Perform Lower Body Dressing: with modified independence;sit to/from stand Pt Will Transfer to Toilet: with modified independence;ambulating Pt Will Perform Toileting - Clothing Manipulation and hygiene: with modified independence;sit to/from stand   OT Frequency:  Min 2X/week       AM-PAC OT 6 Clicks Daily Activity     Outcome Measure Help from another person eating meals?: None Help from another person taking care of personal grooming?: A Little Help from another person toileting, which includes using toliet, bedpan, or urinal?: A Little Help from another person bathing (including washing, rinsing, drying)?: A Little Help from another person to put on and taking off regular upper body clothing?: None Help from another person to  put on and taking off regular lower body clothing?: A Little 6 Click Score: 20   End of Session Equipment Utilized During Treatment: Rolling walker (2 wheels) Nurse Communication: Mobility status  Activity Tolerance: Patient tolerated treatment well Patient left: in bed;with call bell/phone within reach;with bed alarm set;with family/visitor present  OT Visit Diagnosis: Unsteadiness on feet (R26.81);Repeated falls (R29.6);Muscle weakness (generalized) (M62.81)                Time: 8681-8664 OT Time Calculation (min): 17 min Charges:  OT General Charges $OT Visit: 1 Visit OT Evaluation $OT Eval Low Complexity: 1 Low  Izetta Claude, MS, OTR/L , CBIS ascom [phone]  01/28/25, 2:17 PM

## 2025-01-28 NOTE — Consult Note (Addendum)
 "    Urology Consult   I have been asked to see the patient by Dr. Eldonna, for evaluation and management of Left hydronephrosis  HPI:  Madison Benson is a 84 y.o. year old   Admitted 01/27/25 with acute SOB, hypoxia  - CT PE - noted partially imaged severe Left hydronephrosis, similar from 2024  - afebrile, Bps 150-180s, w/ 80% sat on RA   - CTU (01/28/2025)-moderate left hydronephrosis with renal pelvis extending down towards psoas, acute transition point at psoas/near gonadal vessel, fully decompressed ureter distally.  Suspicious for chronic left UPJO.  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  Normal right kidney and ureter.  She saw me in clinic for the first time on 1/21 with [facility] and suspected UTI  - Prescribed 7-day course of Bactrim-although she had not started taking  - UA+, culture = MUF  I spoke with her and her husband today at bedside She is feeling better since admission, improved breathing Intermittent light GH, LUTS similar to last week Denies left flank discomfort    PMH: Past Medical History:  Diagnosis Date   Chronic back pain    HLD (hyperlipidemia)    Hypertension    Lymphedema    Smoker     Surgical History: Past Surgical History:  Procedure Laterality Date   BACK SURGERY       Allergies: Allergies[1]  Family History: Family History  Problem Relation Age of Onset   Heart attack Mother    Heart attack Father    Hypertension Other     Social History:  reports that she has been smoking cigarettes. She has a 32 pack-year smoking history. She has never used smokeless tobacco. She reports current alcohol use of about 5.0 standard drinks of alcohol per week. She reports that she does not use drugs.  ROS: Negative aside from those stated in the HPI.  Physical Exam: BP 124/63   Pulse 80   Temp 98.1 F (36.7 C)   Resp 16   Ht 5' 1 (1.549 m)   Wt 79.8 kg   SpO2 96%   BMI 33.24 kg/m     Constitutional:  Alert and oriented, No acute distress. Cardiovascular: No clubbing, cyanosis, or edema. Respiratory: Normal respiratory effort, no increased work of breathing. GI: Abdomen is soft, nontender, nondistended, no abdominal masses Lymph: No cervical or inguinal lymphadenopathy. Skin: No rashes, bruises or suspicious lesions. Neurologic: Grossly intact, no focal deficits, moving all 4 extremities. Psychiatric: Normal mood and affect.   Laboratory Data: WBC 13.3 (01/27/25) Cr 0.91 (01/27/25)  Pertinent Imaging: I have personally reviewed the CTU (01/28/2025)-moderate left hydronephrosis with renal pelvis extending down towards psoas, acute transition point at psoas/near gonadal vessel, fully decompressed ureter distally.  Left renal atrophy. Suspicious for chronic left UPJO.  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 Possible large blood clots  Normal right kidney and ureter..     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   - No inpatient interventions required - She will need close outpatient follow-up, likely diagnostic cystoscopy, possible TURBT with tissue sampling.  -  Discussed findings with her and her husband today, implications of diagnosis and next steps.  Patient in agreement - Urology to schedule follow-up with Dr. Georganne at discharge  Madison JONELLE Georganne, MD  [facility] [address], [address] [place], [place] [address] [phone]      [1]  Allergies Allergen Reactions   Latex Rash    After  band aid left for couple days After band aid left for couple days After band aid left for couple days    Nsaids Diarrhea and Nausea And Vomiting   Sulfamethoxazole Other (See Comments)    Other Reaction(s): abdominal pain   Sulfa Antibiotics Diarrhea and Nausea And Vomiting   Ciprofloxacin Itching   Diclofenac Sodium Diarrhea   Naproxen Diarrhea    Any anti-inflammatory causes severe diarrhea Any anti-inflammatory causes severe diarrhea Any anti-inflammatory causes severe diarrhea    Sulfamethoxazole-Trimethoprim Nausea And Vomiting and Nausea Only   "

## 2025-01-28 NOTE — Progress Notes (Signed)
 Pt otf in ultrasound

## 2025-01-28 NOTE — Plan of Care (Signed)
" °  Problem: Education: Goal: Knowledge of General Education information will improve Description: Including pain rating scale, medication(s)/side effects and non-pharmacologic comfort measures Outcome: Progressing   Problem: Health Behavior/Discharge Planning: Goal: Ability to manage health-related needs will improve Outcome: Progressing   Problem: Education: Goal: Knowledge of General Education information will improve Description: Including pain rating scale, medication(s)/side effects and non-pharmacologic comfort measures Outcome: Progressing   Problem: Clinical Measurements: Goal: Ability to maintain clinical measurements within normal limits will improve Outcome: Progressing   Problem: Clinical Measurements: Goal: Will remain free from infection Outcome: Progressing   Problem: Clinical Measurements: Goal: Diagnostic test results will improve Outcome: Progressing   "

## 2025-01-28 NOTE — Progress Notes (Signed)
 Pt taken otf for imaging

## 2025-01-28 NOTE — Evaluation (Signed)
 Physical Therapy Evaluation Patient Details Name: Madison Benson MRN: [id number] DOB: 30-Jan-1941 Today's Date: 01/28/2025  History of Present Illness  84 y/o female presented to ED on 01/27/25 for SOB. Admitted for acute respiratory failure with hypoxia. PMH: chronic back pain, HTN, hx of cervical fusion and lumbar fusion, lymphedema, tobacco abuse  Clinical Impression  Patient admitted with the above. PTA, patient and husband live at [facility] and was ambulatory within the home with RW and uses electric scooter for outside of the home. Performs bed mobility with supervision to minA. Stood from EOB with minA and RW. Ambulated to/from bathroom with RW and supervision. SpO2 93% on RA with activity. Patient will benefit from skilled PT services during acute stay to address listed deficits. Patient will benefit from ongoing therapy at discharge to maximize functional independence and safety.         If plan is discharge home, recommend the following: A little help with walking and/or transfers;A little help with bathing/dressing/bathroom;Assistance with cooking/housework;Assist for transportation   Can travel by private vehicle        Equipment Recommendations None recommended by PT  Recommendations for Other Services       Functional Status Assessment Patient has had a recent decline in their functional status and demonstrates the ability to make significant improvements in function in a reasonable and predictable amount of time.     Precautions / Restrictions Precautions Precautions: Fall Recall of Precautions/Restrictions: Intact Restrictions Weight Bearing Restrictions Per Provider Order: No      Mobility  Bed Mobility Overal bed mobility: Needs Assistance Bed Mobility: Supine to Sit, Sit to Supine     Supine to sit: Supervision Sit to supine: Min assist        Transfers Overall transfer level: Needs assistance Equipment used: Rolling Temprance Wyre (2  wheels) Transfers: Sit to/from Stand Sit to Stand: Supervision                Ambulation/Gait Ambulation/Gait assistance: Supervision Gait Distance (Feet): 20 Feet (+20') Assistive device: Rolling Viraat Vanpatten (2 wheels) Gait Pattern/deviations: Step-to pattern, Decreased stride length Gait velocity: decreased     General Gait Details: supervision for safety. SpO2 93% on RA  Stairs            Wheelchair Mobility     Tilt Bed    Modified Rankin (Stroke Patients Only)       Balance Overall balance assessment: Needs assistance Sitting-balance support: Feet supported, No upper extremity supported Sitting balance-Leahy Scale: Good     Standing balance support: Bilateral upper extremity supported, During functional activity Standing balance-Leahy Scale: Fair                               Pertinent Vitals/Pain Pain Assessment Pain Assessment: Faces Faces Pain Scale: Hurts little more Pain Location: neck/back Pain Descriptors / Indicators: Discomfort, Guarding Pain Intervention(s): Limited activity within patient's tolerance, Monitored during session, Repositioned    Home Living Family/patient expects to be discharged to:: Private residence Living Arrangements: Spouse/significant other   Type of Home: Independent living facility           Home Equipment: Rollator (4 wheels);Electric scooter      Prior Function Prior Level of Function : Independent/Modified Independent             Mobility Comments: uses rollator for mobility in the home and electric scooter outside of the home  Extremity/Trunk Assessment   Upper Extremity Assessment Upper Extremity Assessment: Defer to OT evaluation    Lower Extremity Assessment Lower Extremity Assessment: Generalized weakness    Cervical / Trunk Assessment Cervical / Trunk Assessment: Kyphotic  Communication   Communication Communication: No apparent difficulties    Cognition  Arousal: Alert Behavior During Therapy: WFL for tasks assessed/performed   PT - Cognitive impairments: No apparent impairments                         Following commands: Intact       Cueing       General Comments      Exercises     Assessment/Plan    PT Assessment Patient needs continued PT services  PT Problem List Decreased strength;Decreased activity tolerance;Decreased mobility;Decreased balance;Cardiopulmonary status limiting activity       PT Treatment Interventions DME instruction;Gait training;Therapeutic exercise;Functional mobility training;Therapeutic activities;Balance training;Neuromuscular re-education;Patient/family education    PT Goals (Current goals can be found in the Care Plan section)  Acute Rehab PT Goals Patient Stated Goal: to go home before the snow PT Goal Formulation: With patient Time For Goal Achievement: 02/11/25 Potential to Achieve Goals: Good    Frequency Min 2X/week     Co-evaluation               AM-PAC PT 6 Clicks Mobility  Outcome Measure Help needed turning from your back to your side while in a flat bed without using bedrails?: A Little Help needed moving from lying on your back to sitting on the side of a flat bed without using bedrails?: A Little Help needed moving to and from a bed to a chair (including a wheelchair)?: A Little Help needed standing up from a chair using your arms (e.g., wheelchair or bedside chair)?: A Little Help needed to walk in hospital room?: A Little Help needed climbing 3-5 steps with a railing? : A Little 6 Click Score: 18    End of Session   Activity Tolerance: Patient tolerated treatment well Patient left: in bed;with call bell/phone within reach;with bed alarm set;with family/visitor present Nurse Communication: Mobility status PT Visit Diagnosis: Unsteadiness on feet (R26.81);Muscle weakness (generalized) (M62.81)    Time: 8681-8664 PT Time Calculation (min) (ACUTE  ONLY): 17 min   Charges:   PT Evaluation $PT Eval Moderate Complexity: 1 Mod   PT General Charges $$ ACUTE PT VISIT: 1 Visit         Maryanne Finder, PT, DPT Physical Therapist - [facility]   Tamarion Haymond A Casyn Becvar 01/28/2025, 2:06 PM

## 2025-01-28 NOTE — Hospital Course (Addendum)
 Partly taken from H&P.   Madison Benson is a 84 y.o. female with medical history significant of chronic back pain, HLD, HTN. Lymphedema, tobacco abuse presenting w/ acute resp failure w/ hypoxia.  Patient reports increased work of breathing over the past 4 to 5 days.  Patient reports longstanding history of chronic shortness of breath.  1/2 pack/day smoker.  Patient denies any known history or diagnosis of COPD, obstructive lung disease or asthma in the past.  On presentation vitals stable except mild hypoxia in 80s requiring up to 4 L of oxygen.  Labs with leukocytosis at 13.3.  Troponin 38. EKG with atrial fibrillation.  Chest x-ray was negative for any acute abnormality. CTA chest was negative for PE, did show moderately severe bilateral lower lobe atelectasis/infiltrate.  Evidence of prior granulomatous disease. Also noted to have left-sided hydronephrosis and intrarenal calyceal dilatation which was new as compared to the prior study.  Patient was given Solu-Medrol  and DuoNeb and started on ceftriaxone  and Zithromax .  1/30: Vital stable, on 3 L of oxygen, leukocytosis resolved.  Respiratory panel and expanded RVP was negative, preliminary blood cultures negative. Renal ultrasound ordered for further evaluation of left hydronephrosis.  Patient had a recent episode of gross hematuria for which she saw a urologist and was given antibiotics for concern of UTI and CT hematuria was ordered, which shows moderate left hydronephrosis without hydroureter, no obstructing stone, seems like a chronic UPJ obstruction.  There is a concern of multiple filling defects of bladder dome with concern of urothelial neoplasm versus some blood clots.  Urology saw the patient and they are recommending a close outpatient follow-up with cystoscopy and biopsy for further evaluation.  No inpatient procedures needed at this time.  Urine cultures drawn at that time came back negative.  Patient overall feeling better,  saturating well on room air while resting but do drop in mid 80s with ambulation.  Home oxygen was ordered.  She was requesting to go home because of upcoming inclement weather.  Patient was interested in home health PT which was ordered.  She lives in ILF and will get their services.  Patient does have some upper respiratory symptoms, she is being discharged on Augmentin  and Zithromax  to complete a 5-day course for concern of some lung infiltrate/early pneumonia.  Patient will follow-up with her urologist for further assistance regarding her urinary symptoms.  She will continue the rest of her home medications and follow-up with her providers for further assistance.

## 2025-01-28 NOTE — TOC Transition Note (Signed)
 Transition of Care [facility]) - Discharge Note   Patient Details  Name: Madison Benson MRN: [id number] Date of Birth: Jun 30, 1941  Transition of [facility]) CM/SW Contact:  Racheal LITTIE Schimke, RN Phone Number: 01/28/2025, 1:42 PM   Clinical Narrative: CMRN spoke with Crystal at [facility], who covers [facility] where patient lives. Patient receptive to HHPT/OT however prefers to remain with [facility]. HH orders faxed [phone] attention Rollo [place], confirmation received. TOC barriers resolved.     Final next level of care: Home w Home Health Services Barriers to Discharge: Barriers Resolved   Patient Goals and CMS Choice     Choice offered to / list presented to : Patient      Discharge Placement                  Name of family member notified: Husband, Dag Patient and family notified of of transfer: 01/28/25  Discharge Plan and Services Additional resources added to the After Visit Summary for                  DME Arranged: Oxygen DME Agency: [employer] Date DME Agency Contacted: 01/28/25   Representative spoke with at DME Agency: [facility] HH Arranged: PT, OT HH Agency: Other - See comment [facility]) Date HH Agency Contacted: 01/28/25 Time HH Agency Contacted: 1330 Representative spoke with at [facility] Agency: Camelia Hint  Social Drivers of Health (SDOH) Interventions SDOH Screenings   Food Insecurity: No Food Insecurity (01/27/2025)  Housing: Low Risk (01/27/2025)  Transportation Needs: No Transportation Needs (01/27/2025)  Utilities: Not At Risk (01/27/2025)  Financial Resource Strain: Low Risk  (01/19/2025)   Received from [facility]  Social Connections: Moderately Integrated (01/27/2025)  Tobacco Use: High Risk (01/27/2025)     Readmission Risk Interventions     No data to display

## 2025-02-01 LAB — CULTURE, BLOOD (ROUTINE X 2)
Culture: NO GROWTH
Culture: NO GROWTH
Special Requests: ADEQUATE
Special Requests: ADEQUATE

## 2025-02-03 NOTE — Telephone Encounter (Signed)
 Pt is scheduled for Botox injection on 4/8 at [facility]. Please send Rx to [facility] (On file in chart).   Thanks!

## 2025-02-10 ENCOUNTER — Ambulatory Visit: Admitting: Urology

## 2025-02-18 ENCOUNTER — Ambulatory Visit: Admitting: Physician Assistant

## 2025-05-18 ENCOUNTER — Ambulatory Visit: Admitting: Physician Assistant

## 2025-06-10 ENCOUNTER — Ambulatory Visit (INDEPENDENT_AMBULATORY_CARE_PROVIDER_SITE_OTHER): Admitting: Nurse Practitioner
